# Patient Record
Sex: Female | Born: 1950 | Race: White | Hispanic: No | Marital: Married | Smoking: Never smoker
Health system: Southern US, Community
[De-identification: ages and names within clinical notes are randomized; demographics above are authoritative.]

## PROBLEM LIST (undated history)

## (undated) DIAGNOSIS — F419 Anxiety disorder, unspecified: Secondary | ICD-10-CM

## (undated) DIAGNOSIS — M810 Age-related osteoporosis without current pathological fracture: Secondary | ICD-10-CM

---

## 2008-09-16 ENCOUNTER — Emergency Department: Payer: Self-pay | Admitting: Internal Medicine

## 2012-10-24 ENCOUNTER — Emergency Department: Payer: Self-pay | Admitting: Emergency Medicine

## 2012-10-24 LAB — BASIC METABOLIC PANEL
Anion Gap: 8 (ref 7–16)
EGFR (African American): >60
EGFR (Non-African Amer.): >60
Glucose: 95 mg/dL (ref 65–99)
Osmolality: 265 (ref 275–301)
Potassium: 3.4 mmol/L — ABNORMAL LOW (ref 3.5–5.1)
Sodium: 134 mmol/L — ABNORMAL LOW (ref 136–145)

## 2012-10-24 LAB — TSH: Thyroid Stimulating Horm: 3.43 u[IU]/mL

## 2012-10-24 LAB — CBC
HGB: 15.2 g/dL (ref 12.0–16.0)
MCH: 30.5 pg (ref 26.0–34.0)
MCV: 89 fL (ref 80–100)
RBC: 4.99 10*6/uL (ref 3.80–5.20)
RDW: 13.5 % (ref 11.5–14.5)

## 2012-10-24 LAB — PROTIME-INR: INR: 1

## 2012-10-24 LAB — CK TOTAL AND CKMB (NOT AT ARMC)
CK, Total: 82 U/L (ref 21–215)
CK-MB: 1.6 ng/mL (ref 0.5–3.6)

## 2012-10-24 LAB — TROPONIN I: Troponin-I: <0.02 ng/mL

## 2014-04-24 IMAGING — CT CT NECK-CHEST W/ CM
2 series · 10 of 14 positions shown, 12 images · non-contrast
Comparison: none

REASON FOR EXAM: Patient with difficulty swallowing and feels like she is
unable to take a deep b
COMMENTS:

[Series 2: soft tissue · axial · 0.66mm/px · z∈[-446,-56]mm · 7 of 173 slices shown, 9 images]
[im 22/173  soft-tissue]
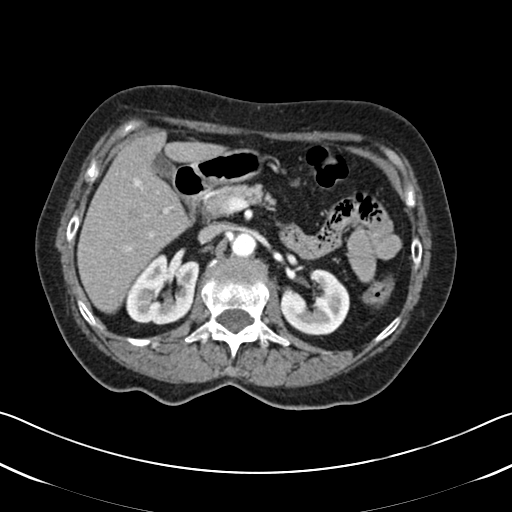
[im 22/173  bone]
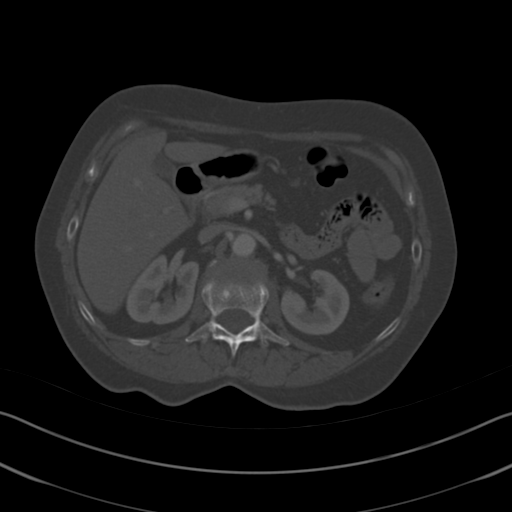
[im 44/173  bone]
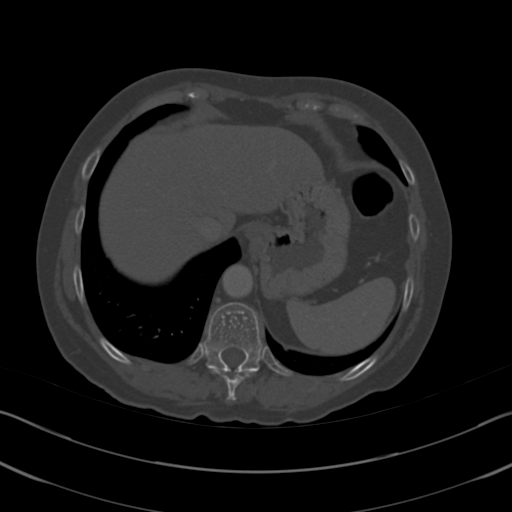
[im 65/173  bone]
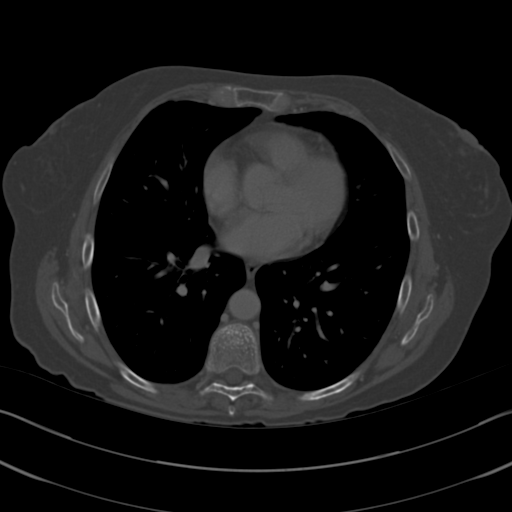
[im 87/173  bone]
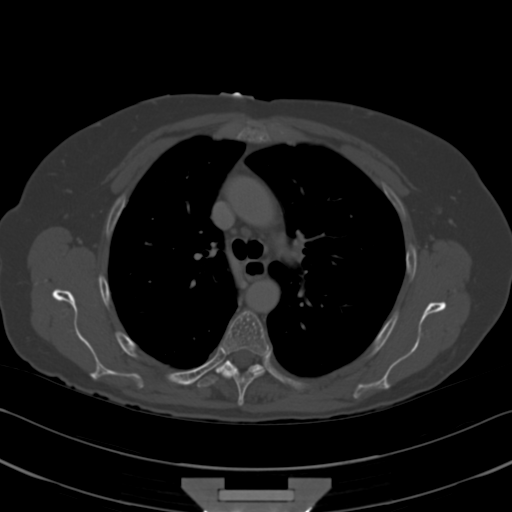
[im 108/173  soft-tissue]
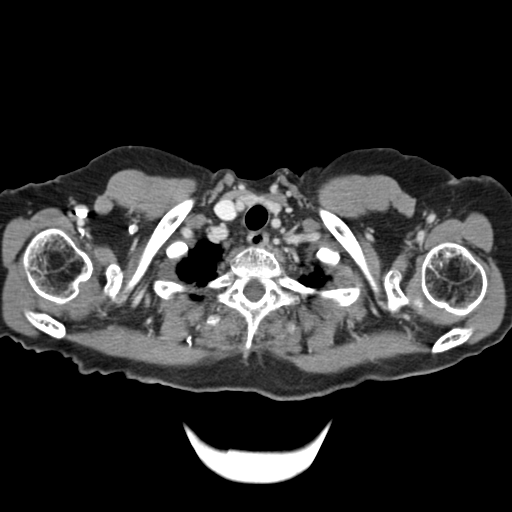
[im 108/173  bone]
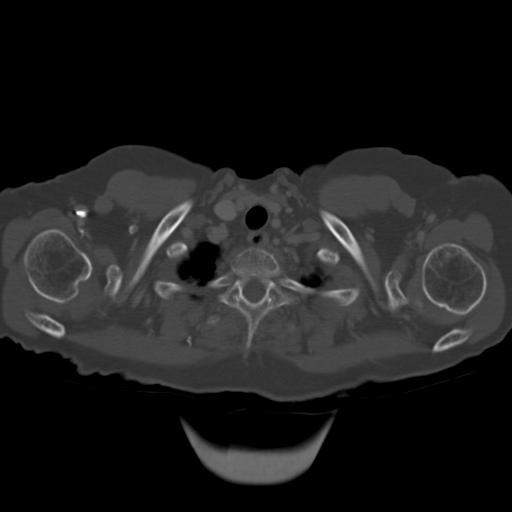
[im 130/173  bone]
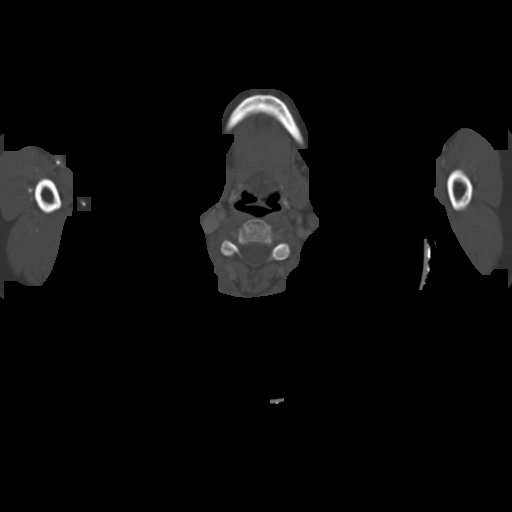
[im 151/173  bone]
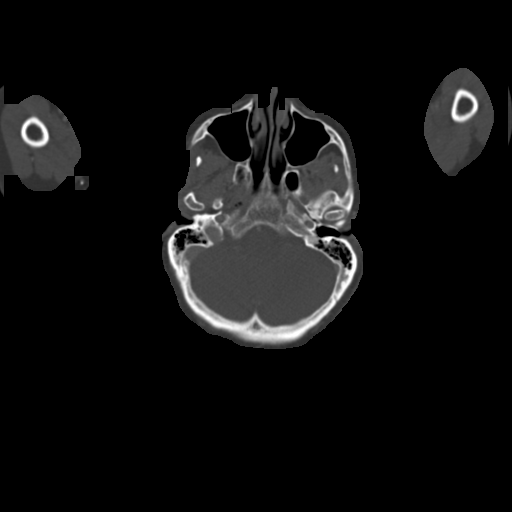

[Series 4: lung windows · axial · 0.66mm/px · z∈[-382,-241]mm · 3 of 95 slices shown]
[im 24/95  bone]
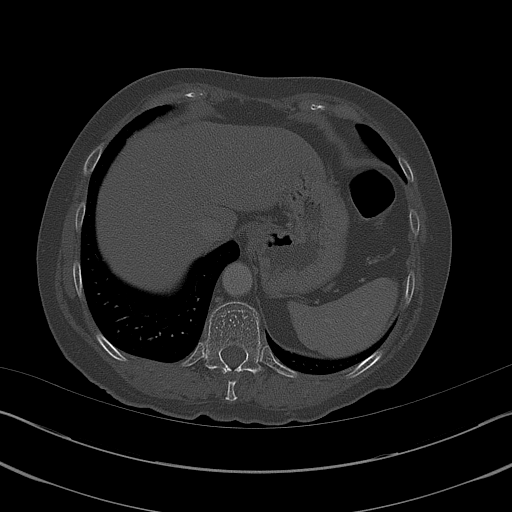
[im 48/95  bone]
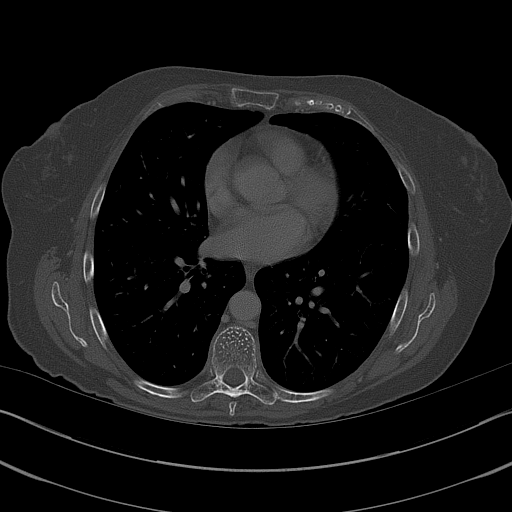
[im 71/95  bone]
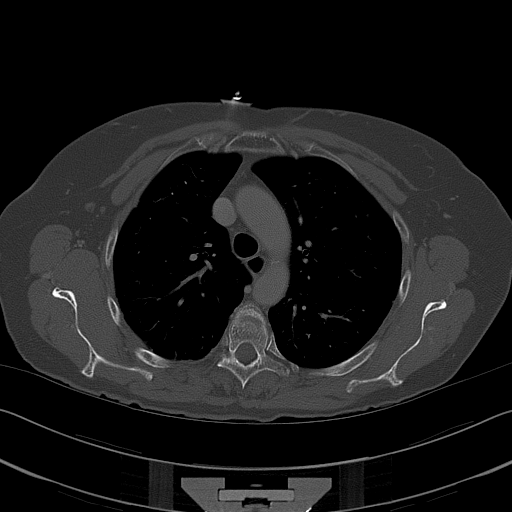

[10 of 14 positions shown; findings below may reference images not displayed]

PROCEDURE:     CT  - CT NECK AND CHEST W  - October 24, 2012  [DATE]

RESULT:     CT of the neck and chest is performed with 75 mL of [id number]
iodinated intravenous contrast with images reconstructed at 3 mm slice
thickness in the axial plane. There is no other previous study for
comparison. The base the brain appears to show no gross abnormality. The
orbital structures, sinuses and mastoid air cells appear to be within normal
limits. The salivary glands are unremarkable without a discrete mass
evident. The nasopharynx, oropharynx and larynx appear to be unremarkable.
The included the thyroid lobes are nonenlarged and enhance homogeneously.
There is air within the esophagus without stenosis or definite wall
thickening. No hiatal hernia is evident. The stomach is nondistended. The
included upper abdominal structures appear unremarkable. There is no
mediastinal or hilar mass or adenopathy. The lungs appear clear area there
is no pneumothorax or focal nodule. There is no infiltrate. No cervical
adenopathy is evident. There is no supraclavicular adenopathy.
IMPRESSION: 1. No focal abnormality evident.

[REDACTED]

## 2018-11-16 DIAGNOSIS — E46 Unspecified protein-calorie malnutrition: Secondary | ICD-10-CM | POA: Insufficient documentation

## 2019-10-13 DIAGNOSIS — U071 COVID-19: Secondary | ICD-10-CM | POA: Insufficient documentation

## 2020-09-17 ENCOUNTER — Emergency Department
Admission: EM | Admit: 2020-09-17 | Discharge: 2020-09-18 | Disposition: A | Discharge status: Home / Self Care | Payer: Medicare Other | Attending: Emergency Medicine | Admitting: Emergency Medicine

## 2020-09-17 ENCOUNTER — Other Ambulatory Visit: Payer: Self-pay

## 2020-09-17 DIAGNOSIS — F419 Anxiety disorder, unspecified: Secondary | ICD-10-CM

## 2020-09-17 DIAGNOSIS — E860 Dehydration: Secondary | ICD-10-CM | POA: Diagnosis not present

## 2020-09-17 HISTORY — DX: Anxiety disorder, unspecified: F41.9

## 2020-09-17 LAB — CBC
HCT: 46.7 % — ABNORMAL HIGH (ref 36.0–46.0)
Hemoglobin: 15.4 g/dL — ABNORMAL HIGH (ref 12.0–15.0)
MCH: 30 pg (ref 26.0–34.0)
MCHC: 33 g/dL (ref 30.0–36.0)
MCV: 91 fL (ref 80.0–100.0)
Platelets: 217 10*3/uL (ref 150–400)
RBC: 5.13 MIL/uL — ABNORMAL HIGH (ref 3.87–5.11)
RDW: 12.8 % (ref 11.5–15.5)
WBC: 6.1 10*3/uL (ref 4.0–10.5)
nRBC: 0 % (ref 0.0–0.2)

## 2020-09-17 LAB — BASIC METABOLIC PANEL
Anion gap: 17 — ABNORMAL HIGH (ref 5–15)
BUN: 12 mg/dL (ref 8–23)
CO2: 17 mmol/L — ABNORMAL LOW (ref 22–32)
Calcium: 9.3 mg/dL (ref 8.9–10.3)
Chloride: 99 mmol/L (ref 98–111)
Creatinine, Ser: 0.67 mg/dL (ref 0.44–1.00)
GFR, Estimated: >60 mL/min (ref >60)
Glucose, Bld: 79 mg/dL (ref 70–99)
Potassium: 4.8 mmol/L (ref 3.5–5.1)
Sodium: 133 mmol/L — ABNORMAL LOW (ref 135–145)

## 2020-09-17 LAB — TROPONIN I (HIGH SENSITIVITY)
Troponin I (High Sensitivity): 5 ng/L (ref <18)
Troponin I (High Sensitivity): 9 ng/L (ref <18)

## 2020-09-17 LAB — T4, FREE: Free T4: 0.69 ng/dL (ref 0.61–1.12)

## 2020-09-17 LAB — MAGNESIUM: Magnesium: 2.2 mg/dL (ref 1.7–2.4)

## 2020-09-17 LAB — TSH: TSH: 13.944 u[IU]/mL — ABNORMAL HIGH (ref 0.350–4.500)

## 2020-09-17 MED ORDER — LORAZEPAM 2 MG/ML IJ SOLN
0.5000 mg | Freq: Once | INTRAMUSCULAR | Status: AC
  Administered 2020-09-17: 0.5 mg via INTRAVENOUS
  Filled 2020-09-17: qty 1

## 2020-09-17 MED ORDER — SODIUM CHLORIDE 0.9 % IV BOLUS
1000.0000 mL | Freq: Once | INTRAVENOUS | Status: AC
  Administered 2020-09-17: 1000 mL via INTRAVENOUS

## 2020-09-17 NOTE — ED Provider Notes (Signed)
[facility] Emergency Department Provider Note  Time seen: 5:51 PM  I have reviewed the triage vital signs and the nursing notes.   HISTORY  Chief Complaint Anxiety    HPI Gina Lane is a 70 y.o. 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  Patient is extremely anxious throughout our evaluation.  Patient admits to anxiety and panic attacks have been worsening over the past few months.  Patient states she was prescribed Ativan by her doctor which she has been taking once a day however discontinued this 2 days ago because she was afraid she was addicted to the medication or reliant on the medication.  Patient states she has not been able to eat or drink over the past 2 to 3 days due to severe anxiety.  Denies any nausea or vomiting but states her "body cannot handle it."  Patient is here with her husband for evaluation.  Has never seen a psychiatrist.  Patient states she takes iodine tablets as well as natural vitamins for hyperthyroidism and adrenal dysfunction which she believes are the cause of her anxiety but has never been diagnosed with either.   Past Medical History:  Diagnosis Date   Anxiety     There are no problems to display for this patient.   Prior to Admission medications   Not on File    Not on File  No family history on file.  Social History    Review of Systems Constitutional: Negative for fever.  Severe anxiety. Cardiovascular: Negative for chest pain. Respiratory: Intermittent shortness of breath with her "panic attacks." Gastrointestinal: Negative for abdominal pain, vomiting and diarrhea. Genitourinary: Negative for urinary compaints Musculoskeletal: Negative for musculoskeletal complaints Neurological: Negative for  headache All other ROS negative  ____________________________________________   PHYSICAL EXAM:  VITAL SIGNS: ED Triage Vitals  Enc Vitals Group     BP 09/17/20 1446 (!) 147/99     Pulse Rate 09/17/20 1446 (!) 116     Resp 09/17/20 1446 (!) 22     Temp 09/17/20 1446 99.4 F (37.4 C)     Temp Source 09/17/20 1446 Oral     SpO2 09/17/20 1446 99 %     Weight 09/17/20 1447 94 lb (42.6 kg)     Height 09/17/20 1447 5' (1.524 m)     Head Circumference --      Peak Flow --      Pain Score 09/17/20 1447 0     Pain Loc --      Pain Edu? --      Excl. in GC? --    Constitutional: Alert and oriented.  Very anxious. Eyes: Normal exam ENT      Head: Normocephalic and atraumatic.      Mouth/Throat: Mucous membranes are moist. Cardiovascular: Normal rate, regular rhythm around 100 bpm. Respiratory: Normal respiratory effort without tachypnea nor retractions. Breath sounds are clear  Gastrointestinal: Soft and nontender. No distention.   Musculoskeletal: Nontender with normal range of motion in all extremities. Neurologic:  Normal speech and language. No gross focal neurologic deficits  Skin:  Skin is warm, dry and intact.  Psychiatric: Extremely anxious.  ____________________________________________    EKG  EKG viewed and interpreted by myself shows sinus rhythm at 100 bpm with a narrow QRS, normal axis, normal intervals, nonspecific ST changes.  INITIAL IMPRESSION / ASSESSMENT AND PLAN / ED COURSE  Pertinent labs & imaging results that were available during my care of the patient were reviewed by me and considered in my medical decision making (see chart for details).   Patient presents to the emergency department for anxiety symptoms not eating or drinking for the past 2 to 3 days.  Patient states she has not been sleeping well over the past week or more.  Patient is quite anxious throughout our evaluation.  States she is self treating her hyperthyroidism and adrenal dysfunction  which she believes other cause for anxiety but has never been formally diagnosed with either.  We will check labs including a thyroid panel.  We will treat with IV fluids and consult psychiatry for further evaluation.  No criteria for IVC at this time but I do believe the patient would benefit from psychiatric evaluation.  Patient is agreeable.  Patient's work-up is largely nonrevealing.  Slightly elevated TSH although normal free T4.  Troponin negative.  Magnesium normal.  Patient was initially dehydrated with her BMP anion gap of 17, has received a liter of fluids.  Patient given 0.5 mg Ativan through her IV now she appears much better.  States she feels normal, appears much improved on repeat evaluation as well.  We are currently awaiting telemetry psychiatry to speak to the patient.  I informed the patient that it could be several hours and she still wishes to wait for now.  Gina Lane was evaluated in Emergency Department on 09/17/2020 for the symptoms described in the history of present illness. She was evaluated in the context of the global COVID-19 pandemic, which necessitated consideration that the patient might be at risk for infection with the SARS-CoV-2 virus that causes COVID-19. Institutional protocols and algorithms that pertain to the evaluation of patients at risk for COVID-19 are in a state of rapid change based on information released by regulatory bodies including the CDC and federal and state organizations. These policies and algorithms were followed during the patient's care in the ED.  ____________________________________________   FINAL CLINICAL IMPRESSION(S) / ED DIAGNOSES  Anxiety Dehydration   Minna Antis, MD 09/17/20 2153

## 2020-09-17 NOTE — ED Triage Notes (Signed)
See triage note, pt reports unable to eat for the past few days. States eating makes her lose her breath and have panic attacks Pt denies pain, states she is just having to count her breathing  Was recently seen for same on Thursday, had cxray and px anxiety meds

## 2020-09-17 NOTE — ED Provider Notes (Signed)
-----------------------------------------   11:17 PM on 09/17/2020 -----------------------------------------  Assuming care from Dr. Lenard Lance.  In short, Gina Lane is a 70 y.o. female with a chief complaint of anxiety.  Refer to the original H&P for additional details.  The current plan of care is to be evaluated by psychiatry.  Anticipate discharge.   ----------------------------------------- 12:36 AM on 09/18/2020 -----------------------------------------  I discussed the case in person with [name] from psychiatry.  She agrees that this is likely anxiety/panic attack related but the patient does not want to try any medications.  She is very leery of medicines and prefers homeopathic and naturalistic remedies.  Initially there was some discussion of her staying in the emergency department for 24 hours to see if she can eat but I explained that that was not practical or necessary and the patient is comfortable with the plan for discharge home.  She is going to try eating smaller amounts at a time and will follow up with her regular provider on Monday.  I gave my usual and customary return precautions.  She does not meet criteria for psychiatric admission at this time nor for medical admission.  The patient is pleased with the evaluation both medically and psychiatrically and will follow-up as an outpatient.   Loleta Rose, MD 09/18/20 [phone]

## 2020-09-17 NOTE — ED Notes (Signed)
Brief changed ?

## 2020-09-17 NOTE — ED Triage Notes (Signed)
Pt in via EMS from home with c/o anxiety. EMS reports pt feels SOB and has not been able to eat for 2 days. With coaching pt able slow breathing down.pt with hx of anxiety, was recently placed on lorazepam. Pt reports took one it helped but then it wore off and her anxiety is back

## 2021-03-05 DIAGNOSIS — E039 Hypothyroidism, unspecified: Secondary | ICD-10-CM | POA: Diagnosis present

## 2021-07-29 ENCOUNTER — Emergency Department: Payer: Medicare Other

## 2021-07-29 ENCOUNTER — Emergency Department
Admission: EM | Admit: 2021-07-29 | Discharge: 2021-07-29 | Disposition: A | Discharge status: Home / Self Care | Payer: Medicare Other | Attending: Emergency Medicine | Admitting: Emergency Medicine

## 2021-07-29 ENCOUNTER — Other Ambulatory Visit: Payer: Self-pay

## 2021-07-29 DIAGNOSIS — F419 Anxiety disorder, unspecified: Secondary | ICD-10-CM | POA: Diagnosis not present

## 2021-07-29 DIAGNOSIS — R079 Chest pain, unspecified: Secondary | ICD-10-CM | POA: Diagnosis not present

## 2021-07-29 DIAGNOSIS — R0602 Shortness of breath: Secondary | ICD-10-CM | POA: Diagnosis present

## 2021-07-29 DIAGNOSIS — R0682 Tachypnea, not elsewhere classified: Secondary | ICD-10-CM | POA: Insufficient documentation

## 2021-07-29 LAB — CBC
HCT: 46.3 % — ABNORMAL HIGH (ref 36.0–46.0)
Hemoglobin: 15 g/dL (ref 12.0–15.0)
MCH: 29.7 pg (ref 26.0–34.0)
MCHC: 32.4 g/dL (ref 30.0–36.0)
MCV: 91.7 fL (ref 80.0–100.0)
Platelets: 237 10*3/uL (ref 150–400)
RBC: 5.05 MIL/uL (ref 3.87–5.11)
RDW: 12.4 % (ref 11.5–15.5)
WBC: 7.2 10*3/uL (ref 4.0–10.5)
nRBC: 0 % (ref 0.0–0.2)

## 2021-07-29 LAB — BASIC METABOLIC PANEL
Anion gap: 14 (ref 5–15)
BUN: 16 mg/dL (ref 8–23)
CO2: 21 mmol/L — ABNORMAL LOW (ref 22–32)
Calcium: 9.6 mg/dL (ref 8.9–10.3)
Chloride: 100 mmol/L (ref 98–111)
Creatinine, Ser: 0.73 mg/dL (ref 0.44–1.00)
GFR, Estimated: >60 mL/min (ref >60)
Glucose, Bld: 97 mg/dL (ref 70–99)
Potassium: 3.9 mmol/L (ref 3.5–5.1)
Sodium: 135 mmol/L (ref 135–145)

## 2021-07-29 LAB — TROPONIN I (HIGH SENSITIVITY): Troponin I (High Sensitivity): 9 ng/L

## 2021-07-29 MED ORDER — LORAZEPAM 0.5 MG PO TABS: 0.5000 mg | ORAL_TABLET | Freq: Once | ORAL | Status: DC

## 2021-07-29 MED ORDER — IOHEXOL 350 MG/ML SOLN
75.0000 mL | Freq: Once | INTRAVENOUS | Status: AC | PRN
  Administered 2021-07-29: 75 mL via INTRAVENOUS

## 2021-07-29 MED ORDER — LORAZEPAM 2 MG/ML IJ SOLN
0.5000 mg | Freq: Once | INTRAMUSCULAR | Status: AC
  Administered 2021-07-29: 0.5 mg via INTRAVENOUS
  Filled 2021-07-29: qty 1
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
## 2021-07-29 NOTE — ED Triage Notes (Signed)
Pt arrives via EMS from home- pt states she got up at 0700 to take her lorazepam- pt states that when she got up for breakfast she started having pain in her ribs under her L breast- pt states after that started she started having anxiety- pt states she is unsure if she is reacting to the lorazepam or if the pain in her ribs is causing her anxiety- pt states she tried to get off the lorazepam a few weeks ago and was weaning off of it and switching to mirtazapine but her anxiety got worse

## 2021-08-23 DIAGNOSIS — M810 Age-related osteoporosis without current pathological fracture: Secondary | ICD-10-CM | POA: Diagnosis present

## 2021-09-04 ENCOUNTER — Emergency Department: Payer: Medicare Other

## 2021-09-04 ENCOUNTER — Other Ambulatory Visit: Payer: Self-pay

## 2021-09-04 ENCOUNTER — Encounter: Payer: Self-pay | Admitting: Intensive Care

## 2021-09-04 ENCOUNTER — Inpatient Hospital Stay
Admission: EM | Admit: 2021-09-04 | Discharge: 2021-09-06 | DRG: 871 | Disposition: A | Discharge status: Home Health | Payer: Medicare Other | Attending: Internal Medicine | Admitting: Internal Medicine

## 2021-09-04 DIAGNOSIS — M810 Age-related osteoporosis without current pathological fracture: Secondary | ICD-10-CM | POA: Diagnosis present

## 2021-09-04 DIAGNOSIS — E876 Hypokalemia: Secondary | ICD-10-CM | POA: Diagnosis present

## 2021-09-04 DIAGNOSIS — E871 Hypo-osmolality and hyponatremia: Secondary | ICD-10-CM | POA: Diagnosis present

## 2021-09-04 DIAGNOSIS — A419 Sepsis, unspecified organism: Secondary | ICD-10-CM | POA: Diagnosis present

## 2021-09-04 DIAGNOSIS — R918 Other nonspecific abnormal finding of lung field: Secondary | ICD-10-CM | POA: Diagnosis present

## 2021-09-04 DIAGNOSIS — J9811 Atelectasis: Secondary | ICD-10-CM | POA: Diagnosis present

## 2021-09-04 DIAGNOSIS — F419 Anxiety disorder, unspecified: Secondary | ICD-10-CM

## 2021-09-04 DIAGNOSIS — R531 Weakness: Secondary | ICD-10-CM

## 2021-09-04 DIAGNOSIS — Z20822 Contact with and (suspected) exposure to covid-19: Secondary | ICD-10-CM | POA: Diagnosis present

## 2021-09-04 DIAGNOSIS — M4854XA Collapsed vertebra, not elsewhere classified, thoracic region, initial encounter for fracture: Secondary | ICD-10-CM | POA: Diagnosis present

## 2021-09-04 DIAGNOSIS — Z880 Allergy status to penicillin: Secondary | ICD-10-CM

## 2021-09-04 DIAGNOSIS — A409 Streptococcal sepsis, unspecified: Secondary | ICD-10-CM | POA: Diagnosis present

## 2021-09-04 DIAGNOSIS — J189 Pneumonia, unspecified organism: Secondary | ICD-10-CM

## 2021-09-04 DIAGNOSIS — Z8616 Personal history of COVID-19: Secondary | ICD-10-CM

## 2021-09-04 DIAGNOSIS — Z79899 Other long term (current) drug therapy: Secondary | ICD-10-CM

## 2021-09-04 DIAGNOSIS — J13 Pneumonia due to Streptococcus pneumoniae: Secondary | ICD-10-CM | POA: Diagnosis present

## 2021-09-04 DIAGNOSIS — E039 Hypothyroidism, unspecified: Secondary | ICD-10-CM | POA: Diagnosis present

## 2021-09-04 HISTORY — DX: Age-related osteoporosis without current pathological fracture: M81.0

## 2021-09-04 LAB — COMPREHENSIVE METABOLIC PANEL
ALT: 21 U/L (ref 0–44)
AST: 26 U/L (ref 15–41)
Albumin: 3.8 g/dL (ref 3.5–5.0)
Alkaline Phosphatase: 101 U/L (ref 38–126)
Anion gap: 14 (ref 5–15)
BUN: 9 mg/dL (ref 8–23)
CO2: 23 mmol/L (ref 22–32)
Calcium: 9 mg/dL (ref 8.9–10.3)
Chloride: 92 mmol/L — ABNORMAL LOW (ref 98–111)
Creatinine, Ser: 0.64 mg/dL (ref 0.44–1.00)
GFR, Estimated: >60 mL/min (ref >60)
Glucose, Bld: 122 mg/dL — ABNORMAL HIGH (ref 70–99)
Potassium: 3.7 mmol/L (ref 3.5–5.1)
Sodium: 129 mmol/L — ABNORMAL LOW (ref 135–145)
Total Bilirubin: 0.8 mg/dL (ref 0.3–1.2)
Total Protein: 7.9 g/dL (ref 6.5–8.1)

## 2021-09-04 LAB — PROTIME-INR
INR: 1.1 (ref 0.8–1.2)
Prothrombin Time: 14 seconds (ref 11.4–15.2)

## 2021-09-04 LAB — CBC WITH DIFFERENTIAL/PLATELET
Abs Immature Granulocytes: 0.05 10*3/uL (ref 0.00–0.07)
Basophils Absolute: 0.1 10*3/uL (ref 0.0–0.1)
Basophils Relative: 0 %
Eosinophils Absolute: 0.1 10*3/uL (ref 0.0–0.5)
Eosinophils Relative: 1 %
HCT: 40.1 % (ref 36.0–46.0)
Hemoglobin: 13.3 g/dL (ref 12.0–15.0)
Immature Granulocytes: 0 %
Lymphocytes Relative: 3 %
Lymphs Abs: 0.5 10*3/uL — ABNORMAL LOW (ref 0.7–4.0)
MCH: 29.6 pg (ref 26.0–34.0)
MCHC: 33.2 g/dL (ref 30.0–36.0)
MCV: 89.1 fL (ref 80.0–100.0)
Monocytes Absolute: 1.4 10*3/uL — ABNORMAL HIGH (ref 0.1–1.0)
Monocytes Relative: 9 %
Neutro Abs: 13.2 10*3/uL — ABNORMAL HIGH (ref 1.7–7.7)
Neutrophils Relative %: 87 %
Platelets: 304 10*3/uL (ref 150–400)
RBC: 4.5 MIL/uL (ref 3.87–5.11)
RDW: 13 % (ref 11.5–15.5)
WBC: 15.2 10*3/uL — ABNORMAL HIGH (ref 4.0–10.5)
nRBC: 0 % (ref 0.0–0.2)

## 2021-09-04 LAB — LACTIC ACID, PLASMA
Lactic Acid, Venous: 1.2 mmol/L (ref 0.5–1.9)
Lactic Acid, Venous: 0.8 mmol/L (ref 0.5–1.9)

## 2021-09-04 LAB — RESP PANEL BY RT-PCR (FLU A&B, COVID) ARPGX2
Influenza A by PCR: NEGATIVE
Influenza B by PCR: NEGATIVE
SARS Coronavirus 2 by RT PCR: NEGATIVE

## 2021-09-04 LAB — TROPONIN I (HIGH SENSITIVITY)
Troponin I (High Sensitivity): 12 ng/L (ref <18)
Troponin I (High Sensitivity): 10 ng/L (ref <18)

## 2021-09-04 LAB — APTT: aPTT: 41 seconds — ABNORMAL HIGH (ref 24–36)

## 2021-09-04 MED ORDER — SODIUM CHLORIDE 0.9 % IV SOLN: 2.0000 g | INTRAVENOUS | Status: DC

## 2021-09-04 MED ORDER — SODIUM CHLORIDE 0.9 % IV SOLN
500.0000 mg | INTRAVENOUS | Status: DC
  Administered 2021-09-05 – 2021-09-06 (×2): 500 mg via INTRAVENOUS
  Filled 2021-09-04: qty 500
  Filled 2021-09-04: qty 5

## 2021-09-04 MED ORDER — SODIUM CHLORIDE 0.9 % IV SOLN: 500.0000 mg | INTRAVENOUS | Status: DC

## 2021-09-04 MED ORDER — METRONIDAZOLE 500 MG/100ML IV SOLN
500.0000 mg | Freq: Once | INTRAVENOUS | Status: AC
  Administered 2021-09-04: 500 mg via INTRAVENOUS
  Filled 2021-09-04: qty 100

## 2021-09-04 MED ORDER — SODIUM CHLORIDE 0.9 % IV SOLN
2.0000 g | INTRAVENOUS | Status: DC
  Administered 2021-09-05 – 2021-09-06 (×2): 2 g via INTRAVENOUS
  Filled 2021-09-04: qty 2
  Filled 2021-09-04: qty 20

## 2021-09-04 MED ORDER — SODIUM CHLORIDE 0.9 % IV SOLN
2.0000 g | Freq: Once | INTRAVENOUS | Status: AC
  Administered 2021-09-04: 2 g via INTRAVENOUS
  Filled 2021-09-04: qty 10

## 2021-09-04 MED ORDER — ACETAMINOPHEN 325 MG PO TABS
650.0000 mg | ORAL_TABLET | Freq: Four times a day (QID) | ORAL | Status: DC | PRN
  Administered 2021-09-05 – 2021-09-06 (×4): 650 mg via ORAL
  Filled 2021-09-04 (×5): qty 2

## 2021-09-04 MED ORDER — ONDANSETRON HCL 4 MG/2ML IJ SOLN: 4.0000 mg | Freq: Four times a day (QID) | INTRAMUSCULAR | Status: DC | PRN

## 2021-09-04 MED ORDER — ENOXAPARIN SODIUM 30 MG/0.3ML IJ SOSY
30.0000 mg | PREFILLED_SYRINGE | INTRAMUSCULAR | Status: DC
Start: 2021-09-04 — End: 2021-09-06
  Administered 2021-09-05 (×2): 30 mg via SUBCUTANEOUS
  Filled 2021-09-04 (×2): qty 0.3

## 2021-09-04 MED ORDER — VANCOMYCIN HCL IN DEXTROSE 1-5 GM/200ML-% IV SOLN
1000.0000 mg | Freq: Once | INTRAVENOUS | Status: AC
  Administered 2021-09-04: 1000 mg via INTRAVENOUS
  Filled 2021-09-04: qty 200

## 2021-09-04 MED ORDER — ACETAMINOPHEN 325 MG PO TABS
650.0000 mg | ORAL_TABLET | Freq: Once | ORAL | Status: AC
  Administered 2021-09-04: 650 mg via ORAL
  Filled 2021-09-04: qty 2

## 2021-09-04 MED ORDER — LORAZEPAM 0.5 MG PO TABS
0.5000 mg | ORAL_TABLET | Freq: Three times a day (TID) | ORAL | Status: DC | PRN
  Administered 2021-09-05 – 2021-09-06 (×2): 0.5 mg via ORAL
  Filled 2021-09-04 (×2): qty 1

## 2021-09-04 MED ORDER — SODIUM CHLORIDE 0.9 % IV BOLUS
1000.0000 mL | Freq: Once | INTRAVENOUS | Status: AC
  Administered 2021-09-04: 1000 mL via INTRAVENOUS

## 2021-09-04 MED ORDER — LACTATED RINGERS IV SOLN: INTRAVENOUS | Status: AC

## 2021-09-04 MED ORDER — IOHEXOL 350 MG/ML SOLN
75.0000 mL | Freq: Once | INTRAVENOUS | Status: AC | PRN
  Administered 2021-09-04: 75 mL via INTRAVENOUS

## 2021-09-04 MED ORDER — MIRTAZAPINE 15 MG PO TABS
30.0000 mg | ORAL_TABLET | Freq: Every day | ORAL | Status: DC
Start: 2021-09-04 — End: 2021-09-06
  Administered 2021-09-05 (×2): 30 mg via ORAL
  Filled 2021-09-04 (×2): qty 2

## 2021-09-04 MED ORDER — ACETAMINOPHEN 650 MG RE SUPP: 650.0000 mg | Freq: Four times a day (QID) | RECTAL | Status: DC | PRN

## 2021-09-04 MED ORDER — ONDANSETRON HCL 4 MG PO TABS: 4.0000 mg | ORAL_TABLET | Freq: Four times a day (QID) | ORAL | Status: DC | PRN

## 2021-09-04 MED ORDER — SODIUM CHLORIDE 0.9 % IV BOLUS (SEPSIS)
1000.0000 mL | Freq: Once | INTRAVENOUS | Status: AC
  Administered 2021-09-04: 1000 mL via INTRAVENOUS

## 2021-09-04 NOTE — Consult Note (Signed)
CODE SEPSIS - PHARMACY COMMUNICATION  **Broad Spectrum Antibiotics should be administered within 1 hour of Sepsis diagnosis**  Time Code Sepsis Called/Page Received: 1827  Antibiotics Ordered: 1827  Time of 1st antibiotic administration: 1856  Additional action taken by pharmacy: n/a  If necessary, Name of Provider/Nurse Contacted: n/a    Derrek Gu ,PharmD Clinical Pharmacist  09/04/2021  6:32 PM

## 2021-09-04 NOTE — Assessment & Plan Note (Signed)
Sepsis criteria includes fever, tachycardia, tachypnea leukocytosis Continue sepsis fluids and treat pneumonia as outlined

## 2021-09-04 NOTE — Assessment & Plan Note (Addendum)
3.1 cm x 3.8 cm x 3.8 cm left upper lobe mass...concerning for malignancy.  Further evaluation with nuclear medicine PET/CT or tissue sampling is recommended.

## 2021-09-04 NOTE — ED Provider Triage Note (Signed)
Emergency Medicine Provider Triage Evaluation Note  Gina Lane , a 71 y.o. female with a history of anxiety who was evaluated in triage.  Pt complains of cough, fever. Also SOB. Tylenol at noon. No dysuria. No recent travel.  There are no problems to display for this patient.    Review of Systems  Positive: Cough, sob, chest pressure, fever Negative: Abd pain, n/v/d, dysuria  Physical Exam  There were no vitals taken for this visit. Gen:   Awake, no distress   Resp:  Normal effort, speaking in complete sentences MSK:   Moves extremities without difficulty  Other:    Medical Decision Making  Medically screening exam initiated at 5:51 PM.  Appropriate orders placed.  Gina Lane was informed that the remainder of the evaluation will be completed by another provider, this initial triage assessment does not replace that evaluation, and the importance of remaining in the ED until their evaluation is complete.    Gina Hoehn, PA-C 09/04/21 1800

## 2021-09-04 NOTE — H&P (Addendum)
History and Physical    Patient: Gina Lane MWN:[id number] DOB: 05-Feb-1951 DOA: 09/04/2021 DOS: the patient was seen and examined on 09/04/2021 PCP: Gildardo Pounds, PA  Patient coming from: Home  Chief Complaint:  Chief Complaint  Patient presents with   Fever   Back Pain   Shortness of Breath    HPI: Gina Lane is a 71 y.o. female with medical history significant for Anxiety, hypothyroidism and osteoporosis, who was seen in the ED on 07/29/2021 for anterior chest wall pain with CTA chest was negative except for thoracic vertebral compression fractures, who who presents to the ED with a 1 week history of cough, fever, back pain and weakness.  Over the past 3 days she started having chest pain on coughing.  She denies shortness of breath, palpitations.  Denies nausea, vomiting or abdominal pain.  Denies affected contacts. ED course and data review: On arrival temperature 102.4 with pulse 136.  Respirations 22-36 with O2 sat 94 to 97% on room air.  BP 127/94. Labs: WBC 15,000 with lactic acid 0.8.  Sodium 129.  Troponin 10.  COVID and flu negative. CTA chest with the following findings: IMPRESSION: 1. No evidence of pulmonary embolism. 2. 3.1 cm x 3.8 cm x 3.8 cm left upper lobe mass. Despite its rapid development since the prior study, there is concerning for malignancy. Further evaluation with nuclear medicine PET/CT or tissue sampling is recommended. Reference: Radiology. 2017; 284(1):228-43 3. Small bilateral pleural effusions with mild to moderate severity posterior bilateral lower lobe atelectasis. 4. Enlarged precarinal and subcarinal lymph nodes which may be, in part, reactive.  Patient started on broad-spectrum antibiotics and sepsis fluids.  Hospitalist consulted for admission.     Past Medical History:  Diagnosis Date   Anxiety    Osteoporosis    History reviewed. No pertinent surgical history. Social History:  reports that she has never smoked. She has  never used smokeless tobacco. She reports current alcohol use of about 7.0 standard drinks of alcohol per week. She reports that she does not use drugs.  Allergies  Allergen Reactions   Penicillins Other (See Comments)    unknown unknown     History reviewed. No pertinent family history.  Prior to Admission medications   Medication Sig Start Date End Date Taking? Authorizing Provider  Ascorbic Acid (VITAMIN C) 1000 MG tablet Take 1,000 mg by mouth daily.   Yes [provider]  LORazepam (ATIVAN) 0.5 MG tablet Take 0.5 mg by mouth every 8 (eight) hours as needed. 09/09/20  Yes [provider]  PARoxetine (PAXIL) 10 MG tablet SMARTSIG:1 Tablet(s) By Mouth Every Evening 08/08/21  Yes [provider]  vitamin B-12 (CYANOCOBALAMIN) 500 MCG tablet Take 500 mcg by mouth daily.   Yes [provider]  mirtazapine (REMERON) 30 MG tablet Take 30 mg by mouth at bedtime. 08/08/21   [provider]  triamcinolone cream (KENALOG) 0.1 % Apply topically 2 (two) times daily as needed. 08/08/21   [provider]    Physical Exam: Vitals:   09/04/21 1900 09/04/21 1930 09/04/21 2100 09/04/21 2102  BP: 131/68 126/83 129/72   Pulse: 97 98 98   Resp: (!) 24 (!) 33 (!) 36   Temp:    98.9 F (37.2 C)  TempSrc:    Oral  SpO2: 96% 95% 97%   Weight:      Height:       Physical Exam Vitals and nursing note reviewed.  Constitutional:  General: She is not in acute distress. HENT:     Head: Normocephalic and atraumatic.  Cardiovascular:     Rate and Rhythm: Regular rhythm. Tachycardia present.     Heart sounds: Normal heart sounds.  Pulmonary:     Effort: Pulmonary effort is normal.     Breath sounds: Rales present.  Abdominal:     Palpations: Abdomen is soft.     Tenderness: There is no abdominal tenderness.  Neurological:     Mental Status: Mental status is at baseline.     Labs on Admission: I have personally reviewed following labs and  imaging studies  CBC: Recent Labs  Lab 09/04/21 1803  WBC 15.2*  NEUTROABS 13.2*  HGB 13.3  HCT 40.1  MCV 89.1  PLT 304   Basic Metabolic Panel: Recent Labs  Lab 09/04/21 1803  NA 129*  K 3.7  CL 92*  CO2 23  GLUCOSE 122*  BUN 9  CREATININE 0.64  CALCIUM 9.0   GFR: Estimated Creatinine Clearance: 38.2 mL/min (by C-G formula based on SCr of 0.64 mg/dL). Liver Function Tests: Recent Labs  Lab 09/04/21 1803  AST 26  ALT 21  ALKPHOS 101  BILITOT 0.8  PROT 7.9  ALBUMIN 3.8   No results for input(s): "LIPASE", "AMYLASE" in the last 168 hours. No results for input(s): "AMMONIA" in the last 168 hours. Coagulation Profile: Recent Labs  Lab 09/04/21 1827  INR 1.1   Cardiac Enzymes: No results for input(s): "CKTOTAL", "CKMB", "CKMBINDEX", "TROPONINI" in the last 168 hours. BNP (last 3 results) No results for input(s): "PROBNP" in the last 8760 hours. HbA1C: No results for input(s): "HGBA1C" in the last 72 hours. CBG: No results for input(s): "GLUCAP" in the last 168 hours. Lipid Profile: No results for input(s): "CHOL", "HDL", "LDLCALC", "TRIG", "CHOLHDL", "LDLDIRECT" in the last 72 hours. Thyroid Function Tests: No results for input(s): "TSH", "T4TOTAL", "FREET4", "T3FREE", "THYROIDAB" in the last 72 hours. Anemia Panel: No results for input(s): "VITAMINB12", "FOLATE", "FERRITIN", "TIBC", "IRON", "RETICCTPCT" in the last 72 hours. Urine analysis: No results found for: "COLORURINE", "APPEARANCEUR", "LABSPEC", "PHURINE", "GLUCOSEU", "HGBUR", "BILIRUBINUR", "KETONESUR", "PROTEINUR", "UROBILINOGEN", "NITRITE", "LEUKOCYTESUR"  Radiological Exams on Admission: CT Angio Chest PE W and/or Wo Contrast  Result Date: 09/04/2021 CLINICAL DATA:  Fever, cough and chest pain. EXAM: CT ANGIOGRAPHY CHEST WITH CONTRAST TECHNIQUE: Multidetector CT imaging of the chest was performed using the standard protocol during bolus administration of intravenous contrast. Multiplanar CT  image reconstructions and MIPs were obtained to evaluate the vascular anatomy. RADIATION DOSE REDUCTION: This exam was performed according to the departmental dose-optimization program which includes automated exposure control, adjustment of the mA and/or kV according to patient size and/or use of iterative reconstruction technique. CONTRAST:  42mL OMNIPAQUE IOHEXOL 350 MG/ML SOLN COMPARISON:  Jul 29, 2021 FINDINGS: Cardiovascular: There is mild calcification of the aortic arch without evidence of aortic aneurysmal dissection. Satisfactory opacification of the pulmonary arteries to the segmental level. No evidence of pulmonary embolism. Normal heart size with mild coronary artery calcification. No pericardial effusion. Mediastinum/Nodes: A 1.5 cm precarinal lymph node is seen. 1.1 cm and 1.3 cm subcarinal lymph nodes are also noted. Thyroid gland, trachea, and esophagus demonstrate no significant findings. Lungs/Pleura: A 3.1 cm x 3.8 cm x 3.8 cm heterogeneous mass is seen within the anteromedial aspect of the left upper lobe. This is located just above the aortic arch and represents a new finding when compared to the prior study. A mild amount of surrounding atelectasis is noted.  Mild to moderate severity atelectasis is also seen within the posterior aspects of the bilateral lower lobes. Mild bronchiectasis is also seen within these regions with mild mucous plugging. There are small bilateral pleural effusions. No pneumothorax is identified. Upper Abdomen: No acute abnormality. Musculoskeletal: Exaggerated kyphosis of the thoracic spine is seen with chronic compression fracture deformities noted at the levels of T9, T11 and L1. Review of the MIP images confirms the above findings. IMPRESSION: 1. No evidence of pulmonary embolism. 2. 3.1 cm x 3.8 cm x 3.8 cm left upper lobe mass. Despite its rapid development since the prior study, there is concerning for malignancy. Further evaluation with nuclear medicine PET/CT  or tissue sampling is recommended. Reference: Radiology. 2017; 284(1):228-43 3. Small bilateral pleural effusions with mild to moderate severity posterior bilateral lower lobe atelectasis. 4. Enlarged precarinal and subcarinal lymph nodes which may be, in part, reactive. Aortic Atherosclerosis (ICD10-I70.0). Electronically Signed   By: Aram Candela M.D.   On: 09/04/2021 20:31   DG Chest 2 View  Result Date: 09/04/2021 CLINICAL DATA:  Cough and fever EXAM: CHEST - 2 VIEW COMPARISON:  07/29/2021 FINDINGS: Small bilateral pleural effusions. No focal consolidation. Stable cardiomediastinal silhouette. No pneumothorax. Multiple compression deformities of the mid to lower thoracic spine IMPRESSION: Interval small bilateral pleural effusion Electronically Signed   By: Jasmine Pang M.D.   On: 09/04/2021 18:42     Data Reviewed: Relevant notes from primary care and specialist visits, past discharge summaries as available in EHR, including Care Everywhere. Prior diagnostic testing as pertinent to current admission diagnoses Updated medications and problem lists for reconciliation ED course, including vitals, labs, imaging, treatment and response to treatment Triage notes, nursing and pharmacy notes and ED provider's notes Notable results as noted in HPI   Assessment and Plan: * Sepsis (HCC) Sepsis criteria includes fever, tachycardia, tachypnea leukocytosis Continue sepsis fluids and treat pneumonia as outlined  Mass of upper lobe of left lung  3.1 cm x 3.8 cm x 3.8 cm left upper lobe mass...concerning for malignancy.  Further evaluation with nuclear medicine PET/CT or tissue sampling is recommended.    CAP (community acquired pneumonia) Possible postobstructive pneumonia, given left upper lobe mass on CT Continue ceftriaxone and azithromycin (patient got aztreonam, Flagyl and vancomycin for sepsis of unknown source prior to CTA chest result) Supplemental O2 if needed, DuoNebs as needed,  antitussives and supportive care Consider ID consult given rapid appearance of left upper lobe heterogeneous mass when compared to prior CT from 07/29/2021  Age-related osteoporosis without current pathological fracture History of falls and wedge compression fractures  Acquired hypothyroidism Not currently on meds  Anxiety Continue Remeron and as needed Ativan.  Patient does not want to take home Paxil    DVT prophylaxis: Lovenox  Consults: none  Advance Care Planning:   Code Status: Full Code   Family Communication: none  Disposition Plan: Back to previous home environment  Severity of Illness: The appropriate patient status for this patient is INPATIENT. Inpatient status is judged to be reasonable and necessary in order to provide the required intensity of service to ensure the patient's safety. The patient's presenting symptoms, physical exam findings, and initial radiographic and laboratory data in the context of their chronic comorbidities is felt to place them at high risk for further clinical deterioration. Furthermore, it is not anticipated that the patient will be medically stable for discharge from the hospital within 2 midnights of admission.   * I certify that at the point of  admission it is my clinical judgment that the patient will require inpatient hospital care spanning beyond 2 midnights from the point of admission due to high intensity of service, high risk for further deterioration and high frequency of surveillance required.*  Author: Andris Baumann, MD 09/04/2021 9:37 PM  For on call review [email or web address].

## 2021-09-04 NOTE — ED Triage Notes (Signed)
Patient presents with fever, cough, cp, and sob X1 week. Last took tylenol at noon today. Denies trouble urinating

## 2021-09-04 NOTE — Sepsis Progress Note (Signed)
Code Sepsis protocol being monitored by eLink. 

## 2021-09-04 NOTE — ED Notes (Signed)
Pt incontinent of urine. Pt brief and bed linen changed. Pt assisted back in bad. Clean gown placed on pt. Pt soiled clothes placed into a pt belongings bag. Call light within reach. Pt has no further needs at this time.

## 2021-09-04 NOTE — ED Provider Notes (Signed)
[facility] Provider Note    Event Date/Time   First MD Initiated Contact with Patient 09/04/21 1914     (approximate)  History   Chief Complaint: Fever, Back Pain, and Shortness of Breath  HPI  Gina Lane is a 71 y.o. female with a past medical history of anxiety presents to the emergency department for back pain cough fever and weakness.  According to the patient over the past week or so she has been experiencing a cough.  She states over the past 3 days she now has chest pain when she coughs and has had a fever.  Patient is febrile to 102.4 on arrival.  Patient has been using Tylenol for her fever.  Patient denies any abdominal pain denies any dysuria.  Patient does have a wet sounding cough during my evaluation at which time patient holds her chest due to discomfort while coughing.  Physical Exam   Triage Vital Signs: ED Triage Vitals  Enc Vitals Group     BP 09/04/21 1752 (!) 127/94     Pulse Rate 09/04/21 1752 (!) 136     Resp 09/04/21 1752 (!) 22     Temp 09/04/21 1752 (!) 102.4 F (39.1 C)     Temp Source 09/04/21 1752 Oral     SpO2 09/04/21 1752 94 %     Weight 09/04/21 1753 94 lb (42.6 kg)     Height 09/04/21 1753 4\' 9"  (1.448 m)     Head Circumference --      Peak Flow --      Pain Score 09/04/21 1752 8     Pain Loc --      Pain Edu? --      Excl. in GC? --     Most recent vital signs: Vitals:   09/04/21 1900 09/04/21 1930  BP: 131/68 126/83  Pulse: 97 98  Resp: (!) 24 (!) 33  Temp:    SpO2: 96% 95%    General: Awake, no distress.  CV:  Good peripheral perfusion.  Regular rate and rhythm  Resp:  Moderate tachypnea around 30 breaths/min.  No obvious wheeze rales or rhonchi on exam.  Patient does have an occasional wet sounding cough during my evaluation. Abd:  No distention.  Soft, nontender.  No rebound or guarding. Other:  No lower extremity edema or tenderness.   ED Results / Procedures / Treatments   EKG  EKG  viewed and interpreted by myself shows sinus tachycardia 105 bpm with a narrow QRS, normal axis, normal intervals, no concerning ST changes.  RADIOLOGY  I have personally viewed and interpreted the x-ray images.  Patient appears to have mild opacity in the right lower lobe. Radiology is read the chest x-ray is bilateral pleural effusions.   MEDICATIONS ORDERED IN ED: Medications  lactated ringers infusion (has no administration in time range)  sodium chloride 0.9 % bolus 1,000 mL (1,000 mLs Intravenous New Bag/Given 09/04/21 1856)  metroNIDAZOLE (FLAGYL) IVPB 500 mg (500 mg Intravenous New Bag/Given 09/04/21 1929)  vancomycin (VANCOCIN) IVPB 1000 mg/200 mL premix (has no administration in time range)  acetaminophen (TYLENOL) tablet 650 mg (650 mg Oral Given 09/04/21 1806)  sodium chloride 0.9 % bolus 1,000 mL (1,000 mLs Intravenous New Bag/Given 09/04/21 1814)  aztreonam (AZACTAM) 2 g in sodium chloride 0.9 % 100 mL IVPB (0 g Intravenous Stopped 09/04/21 1928)     IMPRESSION / MDM / ASSESSMENT AND PLAN / ED COURSE  I reviewed the triage vital  signs and the nursing notes.  Patient's presentation is most consistent with acute presentation with potential threat to life or bodily function.  Patient presents to the emergency department for fever cough chest pain and weakness.  Symptoms have been ongoing x1 week although worse over the past 3 days.  On arrival patient noted to be tachypneic with a wet sounding cough tachycardic around 100 bpm and febrile to 102.4 meeting sepsis criteria.  We will start the patient on broad-spectrum antibiotics.  We will check labs, chest x-ray, urine and continue to closely monitor.  Patient had COVID approximately 2 to 3 years ago but does not had it since.  We will check a COVID swab.  Chest x-ray read as pleural effusions but no obvious pneumonia however my evaluation appears to have opacity in the right lower lobe we will proceed with CTA imaging to help rule out PE  and further evaluate chest to see if the patient has pneumonia.  Lab work shows negative COVID/flu.  Chemistry shows mild hyponatremia.  CBC shows moderate leukocytosis of 15,000.  Lactate reassuringly normal at 1.2 and troponin negative.  Patient will require admission to the hospital once her work-up is been completed given her sepsis criteria.  CTA is pending.  CTA negative for PE but does show a large mass.  Patient will be admitted to the hospital service but will require further work-up for the mass to rule out an oncologic process versus pneumonia.  CRITICAL CARE Performed by: Minna Antis   Total critical care time: 30 minutes  Critical care time was exclusive of separately billable procedures and treating other patients.  Critical care was necessary to treat or prevent imminent or life-threatening deterioration.  Critical care was time spent personally by me on the following activities: development of treatment plan with patient and/or surrogate as well as nursing, discussions with consultants, evaluation of patient's response to treatment, examination of patient, obtaining history from patient or surrogate, ordering and performing treatments and interventions, ordering and review of laboratory studies, ordering and review of radiographic studies, pulse oximetry and re-evaluation of patient's condition.   FINAL CLINICAL IMPRESSION(S) / ED DIAGNOSES   Pneumonia Sepsis    Note:  This document was prepared using Dragon voice recognition software and may include unintentional dictation errors.   Minna Antis, MD 09/04/21 2036

## 2021-09-04 NOTE — Assessment & Plan Note (Signed)
Possible postobstructive pneumonia, given left upper lobe mass on CT Continue ceftriaxone and azithromycin (patient got aztreonam, Flagyl and vancomycin for sepsis of unknown source prior to CTA chest result) Supplemental O2 if needed, DuoNebs as needed, antitussives and supportive care Consider ID consult given rapid appearance of left upper lobe heterogeneous mass when compared to prior CT from 07/29/2021

## 2021-09-04 NOTE — Consult Note (Signed)
PHARMACY -  BRIEF ANTIBIOTIC NOTE   Pharmacy has received consult(s) for vancomycin and aztreonam from an ED provider.  The patient's profile has been reviewed for ht/wt/allergies/indication/available labs.    One time order(s) placed for vancomycin 1 g and aztreonam 2 g  Further antibiotics/pharmacy consults should be ordered by admitting physician if indicated.                       Thank you, Derrek Gu, PharmD 09/04/2021  6:33 PM

## 2021-09-04 NOTE — Assessment & Plan Note (Signed)
Not currently on meds.   °

## 2021-09-04 NOTE — Assessment & Plan Note (Signed)
Continue Remeron and as needed Ativan.  Patient does not want to take home Paxil

## 2021-09-04 NOTE — Assessment & Plan Note (Signed)
History of falls and wedge compression fractures

## 2021-09-05 DIAGNOSIS — A419 Sepsis, unspecified organism: Secondary | ICD-10-CM | POA: Diagnosis not present

## 2021-09-05 DIAGNOSIS — J189 Pneumonia, unspecified organism: Secondary | ICD-10-CM

## 2021-09-05 DIAGNOSIS — R918 Other nonspecific abnormal finding of lung field: Secondary | ICD-10-CM

## 2021-09-05 LAB — URINALYSIS, COMPLETE (UACMP) WITH MICROSCOPIC
Bilirubin Urine: NEGATIVE
Glucose, UA: NEGATIVE mg/dL
Hgb urine dipstick: NEGATIVE
Ketones, ur: 20 mg/dL — AB
Leukocytes,Ua: NEGATIVE
Nitrite: NEGATIVE
Protein, ur: NEGATIVE mg/dL
Specific Gravity, Urine: 1.017 (ref 1.005–1.030)
pH: 5 (ref 5.0–8.0)

## 2021-09-05 LAB — PROCALCITONIN: Procalcitonin: 0.22 ng/mL

## 2021-09-05 LAB — CBC
HCT: 34.2 % — ABNORMAL LOW (ref 36.0–46.0)
Hemoglobin: 11.2 g/dL — ABNORMAL LOW (ref 12.0–15.0)
MCH: 29.6 pg (ref 26.0–34.0)
MCHC: 32.7 g/dL (ref 30.0–36.0)
MCV: 90.2 fL (ref 80.0–100.0)
Platelets: 287 10*3/uL (ref 150–400)
RBC: 3.79 MIL/uL — ABNORMAL LOW (ref 3.87–5.11)
RDW: 13.2 % (ref 11.5–15.5)
WBC: 13.3 10*3/uL — ABNORMAL HIGH (ref 4.0–10.5)
nRBC: 0 % (ref 0.0–0.2)

## 2021-09-05 LAB — BASIC METABOLIC PANEL
Anion gap: 5 (ref 5–15)
BUN: <5 mg/dL — ABNORMAL LOW (ref 8–23)
CO2: 25 mmol/L (ref 22–32)
Calcium: 8.1 mg/dL — ABNORMAL LOW (ref 8.9–10.3)
Chloride: 104 mmol/L (ref 98–111)
Creatinine, Ser: 0.55 mg/dL (ref 0.44–1.00)
GFR, Estimated: >60 mL/min (ref >60)
Glucose, Bld: 105 mg/dL — ABNORMAL HIGH (ref 70–99)
Potassium: 3.3 mmol/L — ABNORMAL LOW (ref 3.5–5.1)
Sodium: 134 mmol/L — ABNORMAL LOW (ref 135–145)

## 2021-09-05 LAB — SURGICAL PCR SCREEN
MRSA, PCR: POSITIVE — AB
Staphylococcus aureus: POSITIVE — AB

## 2021-09-05 LAB — SEDIMENTATION RATE: Sed Rate: 60 mm/hr — ABNORMAL HIGH (ref 0–30)

## 2021-09-05 MED ORDER — ASCORBIC ACID 500 MG PO TABS
1000.0000 mg | ORAL_TABLET | Freq: Every day | ORAL | Status: DC
  Administered 2021-09-05 – 2021-09-06 (×2): 1000 mg via ORAL
  Filled 2021-09-05 (×2): qty 2

## 2021-09-05 MED ORDER — BENZONATATE 100 MG PO CAPS
200.0000 mg | ORAL_CAPSULE | Freq: Three times a day (TID) | ORAL | Status: DC
  Administered 2021-09-05 – 2021-09-06 (×4): 200 mg via ORAL
  Filled 2021-09-05 (×4): qty 2

## 2021-09-05 MED ORDER — KETOROLAC TROMETHAMINE 15 MG/ML IJ SOLN
15.0000 mg | Freq: Four times a day (QID) | INTRAMUSCULAR | Status: DC
  Administered 2021-09-05 – 2021-09-06 (×5): 15 mg via INTRAVENOUS
  Filled 2021-09-05 (×5): qty 1

## 2021-09-05 MED ORDER — GUAIFENESIN-DM 100-10 MG/5ML PO SYRP
5.0000 mL | ORAL_SOLUTION | ORAL | Status: DC | PRN
  Administered 2021-09-06: 5 mL via ORAL
  Filled 2021-09-05: qty 10

## 2021-09-05 NOTE — Progress Notes (Signed)
PROGRESS NOTE    Gina Lane  ELT:[id number] DOB: April 02, 1950 DOA: 09/04/2021 PCP: Rutherford Limerick, PA    Brief Narrative:  71 y.o. female with medical history significant for Anxiety, hypothyroidism and osteoporosis, who was seen in the ED on 07/29/2021 for anterior chest wall pain with CTA chest was negative except for thoracic vertebral compression fractures, who who presents to the ED with a 1 week history of cough, fever, back pain and weakness.  Over the past 3 days she started having chest pain on coughing.  She denies shortness of breath, palpitations.  Denies nausea, vomiting or abdominal pain.  Denies affected contacts.  CTA chest without evidence of pulmonary embolism but however did show a 3 x 3 x 3 left upper lobe mass.  Rapidly developing.  Primary differential is infectious but unable to exclude malignancy at this time.  Pulmonology engaged for recommendations 7/4.   Assessment & Plan:   Principal Problem:   Sepsis ([place]) Active Problems:   CAP (community acquired pneumonia)   Mass of upper lobe of left lung   Anxiety   Acquired hypothyroidism   Age-related osteoporosis without current pathological fracture  Community-acquired pneumonia Sepsis secondary to above Sepsis criteria met with fever, tachycardia, tachypnea, leukocytosis Patient hemodynamically stable Still with some cough Plan: Continue CAP coverage with Rocephin and azithromycin Infectious work-up ordered and pending including Legionella, strep, respiratory culture, Fungitell IV fluids Monitor vitals and fever curve If patient remains hemodynamically stable and on room air consider discharge on 7/5 with outpatient follow-up with pulmonology  Left upper lobe mass 3 cm, noted on CTA chest Was not present on previous CAT scan Rapid development speaks more towards an infectious origin Patient with unclear history but does endorse night sweats and fevers Plan: Treatment as above Infectious work-up as  above Pulmonology engagement, recommendations appreciated If stable as above can consider discharge home on 7/5 with outpatient follow-up with pulmonology  Osteoporosis History of falls and compression wedge fractures Engage PT and OT  Anxiety PTA Remeron and as needed Ativan    DVT prophylaxis: SQ Lovenox Code Status: Full Family Communication:Patients spouse [phone] Disposition Plan: Status is: Inpatient Remains inpatient appropriate because: Community-acquired pneumonia, left upper lobe mass of unclear etiology.  On IV antibiotics.  If clinically improve consider discharge 7/5.   Level of care: Telemetry Medical  Consultants:  Pulmonology  Procedures:  None    Antimicrobials: Ceftriaxone  Azithromycin   Subjective: Seen and examined.  Continues to endorse cough and resultant chest wall pain  Objective: Vitals:   09/05/21 0505 09/05/21 0600 09/05/21 0903 09/05/21 1200  BP: (!) 150/80 131/61 110/66 105/70  Pulse: 95 90 88 89  Resp: 20 (!) _0 Temp: 99.7 F (37.6 C)   98.2 F (36.8 C)  TempSrc: Oral   Oral  SpO2: 97% 94% 96% 98%  Weight:      Height:       No intake or output data in the 24 hours ending 09/05/21 1316 Filed Weights   09/04/21 1753  Weight: 42.6 kg    Examination:  General exam: NAD Respiratory system: Scattered crackles, worse on left, normal work of breathing, 2 L Cardiovascular system: S1-S2, RRR, no murmurs, no pedal edema Gastrointestinal system: Soft, NT/ND, normal bowel sounds Central nervous system: Alert and oriented. No focal neurological deficits. Extremities: Symmetric 5 x 5 power. Skin: No rashes, lesions or ulcers Psychiatry: Judgement and insight appear normal. Mood & affect appropriate.     Data Reviewed:  I have personally reviewed following labs and imaging studies  CBC: Recent Labs  Lab 09/04/21 1803 09/05/21 0508  WBC 15.2* 13.3*  NEUTROABS 13.2*  --   HGB 13.3 11.2*  HCT 40.1 34.2*  MCV 89.1  90.2  PLT 304 161   Basic Metabolic Panel: Recent Labs  Lab 09/04/21 1803 09/05/21 0508  NA 129* 134*  K 3.7 3.3*  CL 92* 104  CO2 23 25  GLUCOSE 122* 105*  BUN 9 <5*  CREATININE 0.64 0.55  CALCIUM 9.0 8.1*   GFR: Estimated Creatinine Clearance: 38.2 mL/min (by C-G formula based on SCr of 0.55 mg/dL). Liver Function Tests: Recent Labs  Lab 09/04/21 1803  AST 26  ALT 21  ALKPHOS 101  BILITOT 0.8  PROT 7.9  ALBUMIN 3.8   No results for input(s): "LIPASE", "AMYLASE" in the last 168 hours. No results for input(s): "AMMONIA" in the last 168 hours. Coagulation Profile: Recent Labs  Lab 09/04/21 1827  INR 1.1   Cardiac Enzymes: No results for input(s): "CKTOTAL", "CKMB", "CKMBINDEX", "TROPONINI" in the last 168 hours. BNP (last 3 results) No results for input(s): "PROBNP" in the last 8760 hours. HbA1C: No results for input(s): "HGBA1C" in the last 72 hours. CBG: No results for input(s): "GLUCAP" in the last 168 hours. Lipid Profile: No results for input(s): "CHOL", "HDL", "LDLCALC", "TRIG", "CHOLHDL", "LDLDIRECT" in the last 72 hours. Thyroid Function Tests: No results for input(s): "TSH", "T4TOTAL", "FREET4", "T3FREE", "THYROIDAB" in the last 72 hours. Anemia Panel: No results for input(s): "VITAMINB12", "FOLATE", "FERRITIN", "TIBC", "IRON", "RETICCTPCT" in the last 72 hours. Sepsis Labs: Recent Labs  Lab 09/04/21 1803 09/04/21 1949 09/05/21 1215  PROCALCITON  --   --  0.22  LATICACIDVEN 1.2 0.8  --     Recent Results (from the past 240 hour(s))  Resp Panel by RT-PCR (Flu A&B, Covid) Anterior Nasal Swab     Status: None   Collection Time: 09/04/21  6:03 PM   Specimen: Anterior Nasal Swab  Result Value Ref Range Status   SARS Coronavirus 2 by RT PCR NEGATIVE NEGATIVE Final    Comment: (NOTE) SARS-CoV-2 target nucleic acids are NOT DETECTED.  The SARS-CoV-2 RNA is generally detectable in upper respiratory specimens during the acute phase of infection.  The lowest concentration of SARS-CoV-2 viral copies this assay can detect is 138 copies/mL. A negative result does not preclude SARS-Cov-2 infection and should not be used as the sole basis for treatment or other patient management decisions. A negative result may occur with  improper specimen collection/handling, submission of specimen other than nasopharyngeal swab, presence of viral mutation(s) within the areas targeted by this assay, and inadequate number of viral copies(<138 copies/mL). A negative result must be combined with clinical observations, patient history, and epidemiological information. The expected result is Negative.  Fact Sheet for Patients:  EntrepreneurPulse.com.au  Fact Sheet for Healthcare Providers:  IncredibleEmployment.be  This test is no t yet approved or cleared by the [place] FDA and  has been authorized for detection and/or diagnosis of SARS-CoV-2 by FDA under an Emergency Use Authorization (EUA). This EUA will remain  in effect (meaning this test can be used) for the duration of the COVID-19 declaration under Section 564(b)(1) of the Act, 21 U.S.C.section 360bbb-3(b)(1), unless the authorization is terminated  or revoked sooner.       Influenza A by PCR NEGATIVE NEGATIVE Final   Influenza B by PCR NEGATIVE NEGATIVE Final    Comment: (NOTE) The Xpert Xpress SARS-CoV-2/FLU/RSV plus  assay is intended as an aid in the diagnosis of influenza from Nasopharyngeal swab specimens and should not be used as a sole basis for treatment. Nasal washings and aspirates are unacceptable for Xpert Xpress SARS-CoV-2/FLU/RSV testing.  Fact Sheet for Patients: EntrepreneurPulse.com.au  Fact Sheet for Healthcare Providers: IncredibleEmployment.be  This test is not yet approved or cleared by the [place] FDA and has been authorized for detection and/or diagnosis of SARS-CoV-2 by FDA  under an Emergency Use Authorization (EUA). This EUA will remain in effect (meaning this test can be used) for the duration of the COVID-19 declaration under Section 564(b)(1) of the Act, 21 U.S.C. section 360bbb-3(b)(1), unless the authorization is terminated or revoked.  Performed at [facility], [address] [place], [place] [address]   Culture, blood (routine x 2)     Status: None (Preliminary result)   Collection Time: 09/04/21  6:03 PM   Specimen: BLOOD  Result Value Ref Range Status   Specimen Description BLOOD RIGHT ANTECUBITAL  Final   Special Requests   Final    BOTTLES DRAWN AEROBIC AND ANAEROBIC Blood Culture adequate volume   Culture   Final    NO GROWTH < 24 HOURS Performed at [facility], [address], [place], [place] [address]    Report Status PENDING  Incomplete  Culture, blood (routine x 2)     Status: None (Preliminary result)   Collection Time: 09/04/21  6:54 PM   Specimen: BLOOD  Result Value Ref Range Status   Specimen Description BLOOD RIGHT ANTECUBITAL  Final   Special Requests   Final    BOTTLES DRAWN AEROBIC AND ANAEROBIC Blood Culture adequate volume   Culture   Final    NO GROWTH < 12 HOURS Performed at [facility], [address] [place], [place] [address]    Report Status PENDING  Incomplete         Radiology Studies: CT Angio Chest PE W and/or Wo Contrast  Result Date: 09/04/2021 CLINICAL DATA:  Fever, cough and chest pain. EXAM: CT ANGIOGRAPHY CHEST WITH CONTRAST TECHNIQUE: Multidetector CT imaging of the chest was performed using the standard protocol during bolus administration of intravenous contrast. Multiplanar CT image reconstructions and MIPs were obtained to evaluate the vascular anatomy. RADIATION DOSE REDUCTION: This exam was performed according to the departmental dose-optimization program which includes automated exposure control, adjustment of the mA and/or kV according to patient size and/or  use of iterative reconstruction technique. CONTRAST:  25m OMNIPAQUE IOHEXOL 350 MG/ML SOLN COMPARISON:  Jul 29, 2021 FINDINGS: Cardiovascular: There is mild calcification of the aortic arch without evidence of aortic aneurysmal dissection. Satisfactory opacification of the pulmonary arteries to the segmental level. No evidence of pulmonary embolism. Normal heart size with mild coronary artery calcification. No pericardial effusion. Mediastinum/Nodes: A 1.5 cm precarinal lymph node is seen. 1.1 cm and 1.3 cm subcarinal lymph nodes are also noted. Thyroid gland, trachea, and esophagus demonstrate no significant findings. Lungs/Pleura: A 3.1 cm x 3.8 cm x 3.8 cm heterogeneous mass is seen within the anteromedial aspect of the left upper lobe. This is located just above the aortic arch and represents a new finding when compared to the prior study. A mild amount of surrounding atelectasis is noted. Mild to moderate severity atelectasis is also seen within the posterior aspects of the bilateral lower lobes. Mild bronchiectasis is also seen within these regions with mild mucous plugging. There are small bilateral pleural effusions. No pneumothorax is identified. Upper Abdomen: No  acute abnormality. Musculoskeletal: Exaggerated kyphosis of the thoracic spine is seen with chronic compression fracture deformities noted at the levels of T9, T11 and L1. Review of the MIP images confirms the above findings. IMPRESSION: 1. No evidence of pulmonary embolism. 2. 3.1 cm x 3.8 cm x 3.8 cm left upper lobe mass. Despite its rapid development since the prior study, there is concerning for malignancy. Further evaluation with nuclear medicine PET/CT or tissue sampling is recommended. Reference: Radiology. 2017; 284(1):228-43 3. Small bilateral pleural effusions with mild to moderate severity posterior bilateral lower lobe atelectasis. 4. Enlarged precarinal and subcarinal lymph nodes which may be, in part, reactive. Aortic  Atherosclerosis (ICD10-I70.0). Electronically Signed   By: Virgina Norfolk M.D.   On: 09/04/2021 20:31   DG Chest 2 View  Result Date: 09/04/2021 CLINICAL DATA:  Cough and fever EXAM: CHEST - 2 VIEW COMPARISON:  07/29/2021 FINDINGS: Small bilateral pleural effusions. No focal consolidation. Stable cardiomediastinal silhouette. No pneumothorax. Multiple compression deformities of the mid to lower thoracic spine IMPRESSION: Interval small bilateral pleural effusion Electronically Signed   By: Donavan Foil M.D.   On: 09/04/2021 18:42        Scheduled Meds:  vitamin C  1,000 mg Oral Daily   benzonatate  200 mg Oral TID   enoxaparin (LOVENOX) injection  30 mg Subcutaneous Q24H   ketorolac  15 mg Intravenous Q6H   mirtazapine  30 mg Oral QHS   Continuous Infusions:  azithromycin Stopped (09/05/21 0952)   cefTRIAXone (ROCEPHIN)  IV Stopped (09/05/21 0817)   lactated ringers 75 mL/hr at 09/05/21 0835     LOS: 1 day      Sidney Ace, MD [facility]   If 7PM-7AM, please contact night-coverage  09/05/2021, 1:16 PM

## 2021-09-05 NOTE — ED Notes (Signed)
Informed RN bed assigned 

## 2021-09-05 NOTE — Consult Note (Signed)
PULMONOLOGY         Date: 09/05/2021,   MRN# [id number] Gina Lane 1950/11/18     AdmissionWeight: 42.6 kg                 CurrentWeight: 42.6 kg  Referring provider: Dr Priscella Mann   CHIEF COMPLAINT:   Pneumonia with possible lung mass on CT chest    HISTORY OF PRESENT ILLNESS   Gina Lane is a 71 y.o. female with medical history significant for , chronic malnutrition, Anxiety, recurrent falls, hypothyroidism and osteoporosis, who was seen in the ED on 07/29/2021 for anterior chest wall pain with CTA chest was negative except for thoracic vertebral compression fractures, who who presents to the ED with a 1 week history of cough, fever, back pain and weakness.  Over the past 3 days she started having chest pain on coughing.  She denies shortness of breath, palpitations.  Denies nausea, vomiting or abdominal pain.  Denies affected contacts.On arrival temperature 102.4 with pulse 136.  Respirations 22-36 with O2 sat 94 to 97% on room air.  BP 127/94.   She reports total of 3 wk of malaise, chest discomfort and cough.  Labs with elevated wbc count otherwise normal. CT chest which was reviewed by me showing spiculated Left upper lobe consolidation with possible mass. Patient is currently being treated for sepsis, pneumonia with Rocephin IV and Azactam & Zithromax as well is IVF at 75cc/hr with LR.   PAST MEDICAL HISTORY   Past Medical History:  Diagnosis Date   Anxiety    Osteoporosis      SURGICAL HISTORY   History reviewed. No pertinent surgical history.   FAMILY HISTORY   History reviewed. No pertinent family history.   SOCIAL HISTORY   Social History   Tobacco Use   Smoking status: Never   Smokeless tobacco: Never  Vaping Use   Vaping Use: Never used  Substance Use Topics   Alcohol use: Yes    Alcohol/week: 7.0 standard drinks of alcohol    Types: 7 Glasses of wine per week   Drug use: Never     MEDICATIONS    Home Medication:   Current Outpatient Rx   Order #: [id number] Class: Historical Med   Order #: [id number] Class: Historical Med   Order #: [id number] Class: Historical Med   Order #: [id number] Class: Historical Med   Order #: [id number] Class: Historical Med   Order #: [id number] Class: Historical Med    Current Medication:  Current Facility-Administered Medications:    acetaminophen (TYLENOL) tablet 650 mg, 650 mg, Oral, Q6H PRN, 650 mg at 09/05/21 0739 **OR** acetaminophen (TYLENOL) suppository 650 mg, 650 mg, Rectal, Q6H PRN, Athena Masse, MD   ascorbic acid (VITAMIN C) tablet 1,000 mg, 1,000 mg, Oral, Daily, Sreenath, Sudheer B, MD, 1,000 mg at 09/05/21 1013   azithromycin (ZITHROMAX) 500 mg in sodium chloride 0.9 % 250 mL IVPB, 500 mg, Intravenous, Q24H, Athena Masse, MD, Stopped at 09/05/21 8299   benzonatate (TESSALON) capsule 200 mg, 200 mg, Oral, TID, Sreenath, Sudheer B, MD, 200 mg at 09/05/21 1013   cefTRIAXone (ROCEPHIN) 2 g in sodium chloride 0.9 % 100 mL IVPB, 2 g, Intravenous, Q24H, Athena Masse, MD, Stopped at 09/05/21 0817   enoxaparin (LOVENOX) injection 30 mg, 30 mg, Subcutaneous, Q24H, Judd Gaudier V, MD, 30 mg at 09/05/21 0000   ketorolac (TORADOL) 15 MG/ML injection 15 mg, 15 mg, Intravenous, Q6H, Sreenath, Sudheer B, MD, 15 mg at 09/05/21 [phone]  lactated ringers infusion, , Intravenous, Continuous, Sreenath, Sudheer B, MD, Last Rate: 75 mL/hr at 09/05/21 0835, Rate Change at 09/05/21 0835   LORazepam (ATIVAN) tablet 0.5 mg, 0.5 mg, Oral, Q8H PRN, Judd Gaudier V, MD, 0.5 mg at 09/05/21 0740   mirtazapine (REMERON) tablet 30 mg, 30 mg, Oral, QHS, Judd Gaudier V, MD, 30 mg at 09/05/21 0000   ondansetron (ZOFRAN) tablet 4 mg, 4 mg, Oral, Q6H PRN **OR** ondansetron (ZOFRAN) injection 4 mg, 4 mg, Intravenous, Q6H PRN, Athena Masse, MD  Current Outpatient Medications:    Ascorbic Acid (VITAMIN C) 1000 MG tablet, Take 1,000 mg by mouth daily., Disp: , Rfl:    LORazepam (ATIVAN) 0.5 MG  tablet, Take 0.5 mg by mouth every 8 (eight) hours as needed., Disp: , Rfl:    PARoxetine (PAXIL) 10 MG tablet, SMARTSIG:1 Tablet(s) By Mouth Every Evening, Disp: , Rfl:    vitamin B-12 (CYANOCOBALAMIN) 500 MCG tablet, Take 500 mcg by mouth daily., Disp: , Rfl:    mirtazapine (REMERON) 30 MG tablet, Take 30 mg by mouth at bedtime., Disp: , Rfl:    triamcinolone cream (KENALOG) 0.1 %, Apply topically 2 (two) times daily as needed., Disp: , Rfl:     ALLERGIES   Penicillins     REVIEW OF SYSTEMS    Review of Systems:  Gen:  Denies  fever, sweats, chills weigh loss  HEENT: Denies blurred vision, double vision, ear pain, eye pain, hearing loss, nose bleeds, sore throat Cardiac:  No dizziness, chest pain or heaviness, chest tightness,edema Resp:   reports dyspnea chronically  Gi: Denies swallowing difficulty, stomach pain, nausea or vomiting, diarrhea, constipation, bowel incontinence Gu:  Denies bladder incontinence, burning urine Ext:   Denies Joint pain, stiffness or swelling Skin: Denies  skin rash, easy bruising or bleeding or hives Endoc:  Denies polyuria, polydipsia , polyphagia or weight change Psych:   Denies depression, insomnia or hallucinations   Other:  All other systems negative   VS: BP 110/66 (BP Location: Right Arm)   Pulse 88   Temp 99.7 F (37.6 C) (Oral)   Resp 16   Ht $R'4\' 9"'Ns$  (1.448 m)   Wt 42.6 kg   SpO2 96%   BMI 20.34 kg/m      PHYSICAL EXAM    GENERAL:NAD, no fevers, chills, no weakness no fatigue HEAD: Normocephalic, atraumatic.  EYES: Pupils equal, round, reactive to light. Extraocular muscles intact. No scleral icterus.  MOUTH: Moist mucosal membrane. Dentition intact. No abscess noted.  EAR, NOSE, THROAT: Clear without exudates. No external lesions.  NECK: Supple. No thyromegaly. No nodules. No JVD.  PULMONARY: decreased breath sounds with mild rhonchi worse at bases bilaterally.  CARDIOVASCULAR: S1 and S2. Regular rate and rhythm. No  murmurs, rubs, or gallops. No edema. Pedal pulses 2+ bilaterally.  GASTROINTESTINAL: Soft, nontender, nondistended. No masses. Positive bowel sounds. No hepatosplenomegaly.  MUSCULOSKELETAL: No swelling, clubbing, or edema. Range of motion full in all extremities.  NEUROLOGIC: Cranial nerves II through XII are intact. No gross focal neurological deficits. Sensation intact. Reflexes intact.  SKIN: No ulceration, lesions, rashes, or cyanosis. Skin warm and dry. Turgor intact.  PSYCHIATRIC: Mood, affect within normal limits. The patient is awake, alert and oriented x 3. Insight, judgment intact.       IMAGING     ASSESSMENT/PLAN   Acute community acquired pneumonia of left upper lobe  - present on admission  - COVID19 negative - last + 3 years ago - mild case   -  supplemental O2 during my evaluation -room air -unlikely viral -serum fungitell in process  -legionella ab -strep pneumoniae ur AG -Histoplasma Ur Ag -sputum resp cultures -reviewed pertinent imaging with patient today - ESR -please encourage patient to use incentive spirometer few times each hour while hospitalized.     Left upper lobe mass   - possible infectious only    - patient never smoker with no family or personal history of neoplasia.    - Low pretest probability of malignancy    - she does not vaccinate against [phone]   -she had covid mild case in 2020            Thank you for allowing me to participate in the care of this patient.   Patient/Family are satisfied with care plan and all questions have been answered.    Provider disclosure: Patient with at least one acute or chronic illness or injury that poses a threat to life or bodily function and is being managed actively during this encounter.  All of the below services have been performed independently by signing provider:  review of prior documentation from internal and or external health records.  Review of previous and current lab results.   Interview and comprehensive assessment during patient visit today. Review of current and previous chest radiographs/CT scans. Discussion of management and test interpretation with health care team and patient/family.   This document was prepared using Dragon voice recognition software and may include unintentional dictation errors.     Ottie Glazier, M.D.  Division of Pulmonary & Critical Care Medicine

## 2021-09-05 NOTE — ED Notes (Signed)
Assisted patient up to the bedside commode. Patient changed into a new gown and bed pads.

## 2021-09-05 NOTE — ED Notes (Signed)
Morning labs drawn from pt at this time.  Pt given pillow and more warm blankets for comfort.  Pt states she does not have to use restroom at this time.  Pt denies any other needs at this time.

## 2021-09-06 ENCOUNTER — Encounter: Payer: Self-pay | Admitting: Internal Medicine

## 2021-09-06 DIAGNOSIS — E871 Hypo-osmolality and hyponatremia: Secondary | ICD-10-CM | POA: Diagnosis present

## 2021-09-06 DIAGNOSIS — J189 Pneumonia, unspecified organism: Secondary | ICD-10-CM | POA: Diagnosis not present

## 2021-09-06 DIAGNOSIS — E876 Hypokalemia: Secondary | ICD-10-CM | POA: Diagnosis not present

## 2021-09-06 DIAGNOSIS — A419 Sepsis, unspecified organism: Secondary | ICD-10-CM | POA: Diagnosis not present

## 2021-09-06 LAB — CBC WITH DIFFERENTIAL/PLATELET
Abs Immature Granulocytes: 0.11 10*3/uL — ABNORMAL HIGH (ref 0.00–0.07)
Basophils Absolute: 0.1 10*3/uL (ref 0.0–0.1)
Basophils Relative: 1 %
Eosinophils Absolute: 0.3 10*3/uL (ref 0.0–0.5)
Eosinophils Relative: 2 %
HCT: 34.3 % — ABNORMAL LOW (ref 36.0–46.0)
Hemoglobin: 11.3 g/dL — ABNORMAL LOW (ref 12.0–15.0)
Immature Granulocytes: 1 %
Lymphocytes Relative: 9 %
Lymphs Abs: 1.1 10*3/uL (ref 0.7–4.0)
MCH: 29.6 pg (ref 26.0–34.0)
MCHC: 32.9 g/dL (ref 30.0–36.0)
MCV: 89.8 fL (ref 80.0–100.0)
Monocytes Absolute: 1.1 10*3/uL — ABNORMAL HIGH (ref 0.1–1.0)
Monocytes Relative: 9 %
Neutro Abs: 9.4 10*3/uL — ABNORMAL HIGH (ref 1.7–7.7)
Neutrophils Relative %: 78 %
Platelets: 347 10*3/uL (ref 150–400)
RBC: 3.82 MIL/uL — ABNORMAL LOW (ref 3.87–5.11)
RDW: 13.5 % (ref 11.5–15.5)
WBC: 12.1 10*3/uL — ABNORMAL HIGH (ref 4.0–10.5)
nRBC: 0 % (ref 0.0–0.2)

## 2021-09-06 LAB — STREP PNEUMONIAE URINARY ANTIGEN: Strep Pneumo Urinary Antigen: POSITIVE — AB

## 2021-09-06 LAB — BASIC METABOLIC PANEL
Anion gap: 11 (ref 5–15)
BUN: 5 mg/dL — ABNORMAL LOW (ref 8–23)
CO2: 19 mmol/L — ABNORMAL LOW (ref 22–32)
Calcium: 7.9 mg/dL — ABNORMAL LOW (ref 8.9–10.3)
Chloride: 101 mmol/L (ref 98–111)
Creatinine, Ser: 0.68 mg/dL (ref 0.44–1.00)
GFR, Estimated: >60 mL/min (ref >60)
Glucose, Bld: 194 mg/dL — ABNORMAL HIGH (ref 70–99)
Potassium: 3.2 mmol/L — ABNORMAL LOW (ref 3.5–5.1)
Sodium: 131 mmol/L — ABNORMAL LOW (ref 135–145)

## 2021-09-06 LAB — LEGIONELLA PNEUMOPHILA SEROGP 1 UR AG: L. pneumophila Serogp 1 Ur Ag: NEGATIVE

## 2021-09-06 LAB — C-REACTIVE PROTEIN: CRP: 18.1 mg/dL — ABNORMAL HIGH (ref <1.0)

## 2021-09-06 LAB — URINE CULTURE: Culture: NO GROWTH

## 2021-09-06 MED ORDER — LEVOFLOXACIN 750 MG PO TABS: 750.0000 mg | ORAL_TABLET | Freq: Every day | ORAL | 0 refills | Status: AC

## 2021-09-06 MED ORDER — POTASSIUM CHLORIDE CRYS ER 20 MEQ PO TBCR
40.0000 meq | EXTENDED_RELEASE_TABLET | Freq: Once | ORAL | Status: AC
Start: 2021-09-06 — End: 2021-09-06
  Administered 2021-09-06: 40 meq via ORAL
  Filled 2021-09-06: qty 2

## 2021-09-06 MED ORDER — GUAIFENESIN-DM 100-10 MG/5ML PO SYRP
5.0000 mL | ORAL_SOLUTION | Freq: Four times a day (QID) | ORAL | 0 refills | Status: AC | PRN
Start: 2021-09-06 — End: ?

## 2021-09-06 MED ORDER — MUPIROCIN 2 % EX OINT
1.0000 | TOPICAL_OINTMENT | Freq: Two times a day (BID) | CUTANEOUS | Status: DC
  Administered 2021-09-06: 1 via NASAL
  Filled 2021-09-06: qty 22

## 2021-09-06 MED ORDER — CHLORHEXIDINE GLUCONATE CLOTH 2 % EX PADS: 6.0000 | MEDICATED_PAD | Freq: Every day | CUTANEOUS | Status: DC

## 2021-09-06 NOTE — Evaluation (Signed)
Occupational Therapy Evaluation Patient Details Name: Gina Lane MRN: [id number] DOB: 03/29/1950 Today's Date: 09/06/2021   History of Present Illness Patient is a 71 year old female with 1 week history of cough, fever, back pain and weakness. Found to have sepsis with community aquired pneumonia. Chest pain on coughing with left upper lobe mass found with CTA chest.  History of falls and compression wedge fractures, anxiety, osteoporosis.   Clinical Impression   Pt was seen for OT evaluation this date. Prior to hospital admission, pt was using [facility] for functional mobility in home and rollator for community distances and requires assistance with LBD and lower body bathing 2/2 back pain. Pt lives with spouse in a one level house with 3 steps to enter. Pt reported generalized pain in back and ribs. Pt presents to acute OT demonstrating impaired ADL performance and functional mobility 2/2 decreased activity tolerance and functional strength/ROM/balance deficits.   Pt currently requires SUPERVISION with HOB elevated for bed mobility and STS - pt is familiar with log roll and able to demonstrate. SUPERVISION + RW with min vcs for safe hand placement for toilet t/f. SUPERVISION with single UE support on grab bar for pericare in standing. MIN A for grooming tasks with in intermittent single UE support on sink in standing with 1 seated rest break - needs assistance with brushing back of head and braiding hair, pt reported standing while reaching up to brush hair causes rib pain/discomfort. MOD I for lower body dressing and donning/doffing socks with AD while seated. Pt left in bed with all needs met. Pt would benefit from skilled OT to address noted impairments and functional limitations (see below for any additional details). Upon hospital discharge, recommend HHOT to maximize pt safety and return to PLOF.      Recommendations for follow up therapy are one component of a multi-disciplinary discharge  planning process, led by the attending physician.  Recommendations may be updated based on patient status, additional functional criteria and insurance authorization.   Follow Up Recommendations  Home health OT    Assistance Recommended at Discharge Set up Supervision/Assistance  Patient can return home with the following A little help with walking and/or transfers;A lot of help with bathing/dressing/bathroom;Assistance with cooking/housework;Assist for transportation    Functional Status Assessment  Patient has had a recent decline in their functional status and demonstrates the ability to make significant improvements in function in a reasonable and predictable amount of time.  Equipment Recommendations  Other (comment) (per next venue of care)    Recommendations for Other Services       Precautions / Restrictions Precautions Precautions: Fall Precaution Comments: history of wedge compression fractures and pain with trunk flexion Restrictions Weight Bearing Restrictions: No      Mobility Bed Mobility Overal bed mobility: Needs Assistance Bed Mobility: Supine to Sit, Sit to Supine     Supine to sit: Supervision, HOB elevated Sit to supine: Supervision, HOB elevated   General bed mobility comments: increased time required with no physical assistance needed    Transfers Overall transfer level: Needs assistance Equipment used: Rolling walker (2 wheels) Transfers: Sit to/from Stand Sit to Stand: Supervision                  Balance Overall balance assessment: Needs assistance Sitting-balance support: Feet supported, No upper extremity supported Sitting balance-Leahy Scale: Good     Standing balance support: During functional activity, Single extremity supported Standing balance-Leahy Scale: Fair Standing balance comment: able to stand  and perform pericare with single UE support                           ADL either performed or assessed with  clinical judgement   ADL Overall ADL's : Needs assistance/impaired                                       General ADL Comments: SUPERVISION + RW with min vcs for safe hand placement for toilet t/f. SUPERVISION with single UE support on grab bar for pericare in standing. MIN A for grooming tasks in standing with 1 seated rest break - needs assistance with brushing back of head and braiding hair, pt reported standing causes rib pain. MOD I for lower body dressing and donning/doffing socks with AD while seated.      Pertinent Vitals/Pain Pain Assessment Pain Assessment: Faces Faces Pain Scale: Hurts a little bit Pain Location: back Pain Descriptors / Indicators: Discomfort, Sore Pain Intervention(s): Limited activity within patient's tolerance, Monitored during session, Repositioned     Hand Dominance     Extremity/Trunk Assessment Upper Extremity Assessment Upper Extremity Assessment: Generalized weakness   Lower Extremity Assessment Lower Extremity Assessment: Generalized weakness       Communication Communication Communication: No difficulties   Cognition Arousal/Alertness: Awake/alert Behavior During Therapy: WFL for tasks assessed/performed Overall Cognitive Status: Within Functional Limits for tasks assessed                                                  Home Living Family/patient expects to be discharged to:: Private residence Living Arrangements: Spouse/significant other Available Help at Discharge: Family Type of Home: House Home Access: Stairs to enter Technical brewer of Steps: 3   Home Layout: One level     Bathroom Shower/Tub: Sponge bathes at baseline   Bathroom Toilet: Handicapped height     Home Equipment: Rollator (4 wheels);Cane - single point          Prior Functioning/Environment Prior Level of Function : Independent/Modified Independent             Mobility Comments: using cane inside,  rollator for the community. intermittent HHA from spouse with walking and stairs ADLs Comments: Needs assistance with LBD and lower body bathing due to back pain        OT Problem List: Decreased strength;Decreased range of motion;Decreased activity tolerance;Impaired balance (sitting and/or standing);Decreased knowledge of use of DME or AE      OT Treatment/Interventions: Self-care/ADL training;Therapeutic exercise;DME and/or AE instruction;Energy conservation;Therapeutic activities;Patient/family education;Balance training    OT Goals(Current goals can be found in the care plan section) Acute Rehab OT Goals Patient Stated Goal: to go home OT Goal Formulation: With patient Time For Goal Achievement: 09/20/21 Potential to Achieve Goals: Good ADL Goals Pt Will Perform Grooming: with supervision;standing;with adaptive equipment (with LRAD) Pt Will Perform Lower Body Bathing: with adaptive equipment;with min assist;sitting/lateral leans Pt Will Transfer to Toilet: with modified independence;ambulating;regular height toilet (with raised height and LRAD)  OT Frequency: Min 2X/week       AM-PAC OT "6 Clicks" Daily Activity     Outcome Measure Help from another person eating meals?: None Help from another person taking care of personal grooming?: A  Little Help from another person toileting, which includes using toliet, bedpan, or urinal?: A Little Help from another person bathing (including washing, rinsing, drying)?: A Lot Help from another person to put on and taking off regular upper body clothing?: A Little Help from another person to put on and taking off regular lower body clothing?: A Lot 6 Click Score: 17   End of Session Equipment Utilized During Treatment: Gait belt;Rolling walker (2 wheels)  Activity Tolerance: Patient tolerated treatment well Patient left: in bed;with call bell/phone within reach;with bed alarm set  OT Visit Diagnosis: Muscle weakness (generalized) (M62.81)                 Time: 1125-1150 OT Time Calculation (min): 25 min Charges:  OT General Charges $OT Visit: 1 Visit OT Evaluation $OT Eval Moderate Complexity: 1 Mod OT Treatments $Self Care/Home Management : 8-22 mins  [employer], OTDS  [employer] 09/06/2021, 1:36 PM

## 2021-09-06 NOTE — Progress Notes (Signed)
PULMONOLOGY         Date: 09/06/2021,   MRN# [id number] Gina Lane [birth year]     AdmissionWeight: 42.6 kg                 CurrentWeight: 42.6 kg  Referring provider: Dr Priscella Mann   CHIEF COMPLAINT:   Pneumonia with possible lung mass on CT chest    HISTORY OF PRESENT ILLNESS   Gina Lane is a 71 y.o. female with medical history significant for , chronic malnutrition, Anxiety, recurrent falls, hypothyroidism and osteoporosis, who was seen in the ED on 07/29/2021 for anterior chest wall pain with CTA chest was negative except for thoracic vertebral compression fractures, who who presents to the ED with a 1 week history of cough, fever, back pain and weakness.  Over the past 3 days she started having chest pain on coughing.  She denies shortness of breath, palpitations.  Denies nausea, vomiting or abdominal pain.  Denies affected contacts.On arrival temperature 102.4 with pulse 136.  Respirations 22-36 with O2 sat 94 to 97% on room air.  BP 127/94.   She reports total of 3 wk of malaise, chest discomfort and cough.  Labs with elevated wbc count otherwise normal. CT chest which was reviewed by me showing spiculated Left upper lobe consolidation with possible mass. Patient is currently being treated for sepsis, pneumonia with Rocephin IV and Azactam & Zithromax as well is IVF at 75cc/hr with LR.  09/06/21- patient is improved and may continue therapy for pna on outpatient with follow up in pulmonary clinic.    PAST MEDICAL HISTORY   Past Medical History:  Diagnosis Date   Anxiety    Osteoporosis      SURGICAL HISTORY   History reviewed. No pertinent surgical history.   FAMILY HISTORY   History reviewed. No pertinent family history.   SOCIAL HISTORY   Social History   Tobacco Use   Smoking status: Never   Smokeless tobacco: Never  Vaping Use   Vaping Use: Never used  Substance Use Topics   Alcohol use: Yes    Alcohol/week: 7.0 standard drinks of  alcohol    Types: 7 Glasses of wine per week   Drug use: Never     MEDICATIONS    Home Medication:     Current Medication:  Current Facility-Administered Medications:    acetaminophen (TYLENOL) tablet 650 mg, 650 mg, Oral, Q6H PRN, 650 mg at 09/06/21 0438 **OR** acetaminophen (TYLENOL) suppository 650 mg, 650 mg, Rectal, Q6H PRN, Athena Masse, MD   ascorbic acid (VITAMIN C) tablet 1,000 mg, 1,000 mg, Oral, Daily, Sreenath, Sudheer B, MD, 1,000 mg at 09/06/21 0805   azithromycin (ZITHROMAX) 500 mg in sodium chloride 0.9 % 250 mL IVPB, 500 mg, Intravenous, Q24H, Judd Gaudier V, MD, Last Rate: 250 mL/hr at 09/06/21 0840, 500 mg at 09/06/21 0840   benzonatate (TESSALON) capsule 200 mg, 200 mg, Oral, TID, Sreenath, Sudheer B, MD, 200 mg at 09/06/21 0805   cefTRIAXone (ROCEPHIN) 2 g in sodium chloride 0.9 % 100 mL IVPB, 2 g, Intravenous, Q24H, Judd Gaudier V, MD, Last Rate: 200 mL/hr at 09/06/21 0758, 2 g at 09/06/21 0758   Chlorhexidine Gluconate Cloth 2 % PADS 6 each, 6 each, Topical, Q0600, Jennye Boroughs, MD   enoxaparin (LOVENOX) injection 30 mg, 30 mg, Subcutaneous, Q24H, Judd Gaudier V, MD, 30 mg at 09/05/21 2131   guaiFENesin-dextromethorphan (ROBITUSSIN DM) 100-10 MG/5ML syrup 5 mL, 5 mL, Oral, Q4H PRN,  Ralene Muskrat B, MD, 5 mL at 09/06/21 0438   ketorolac (TORADOL) 15 MG/ML injection 15 mg, 15 mg, Intravenous, Q6H, Sreenath, Sudheer B, MD, 15 mg at 09/06/21 0553   LORazepam (ATIVAN) tablet 0.5 mg, 0.5 mg, Oral, Q8H PRN, Athena Masse, MD, 0.5 mg at 09/06/21 0746   mirtazapine (REMERON) tablet 30 mg, 30 mg, Oral, QHS, Athena Masse, MD, 30 mg at 09/05/21 2130   mupirocin ointment (BACTROBAN) 2 % 1 Application, 1 Application, Nasal, BID, Jennye Boroughs, MD, 1 Application at 02/25/81 0805   ondansetron (ZOFRAN) tablet 4 mg, 4 mg, Oral, Q6H PRN **OR** ondansetron (ZOFRAN) injection 4 mg, 4 mg, Intravenous, Q6H PRN, Athena Masse, MD    ALLERGIES    Penicillins     REVIEW OF SYSTEMS    Review of Systems:  Gen:  Denies  fever, sweats, chills weigh loss  HEENT: Denies blurred vision, double vision, ear pain, eye pain, hearing loss, nose bleeds, sore throat Cardiac:  No dizziness, chest pain or heaviness, chest tightness,edema Resp:   reports dyspnea chronically  Gi: Denies swallowing difficulty, stomach pain, nausea or vomiting, diarrhea, constipation, bowel incontinence Gu:  Denies bladder incontinence, burning urine Ext:   Denies Joint pain, stiffness or swelling Skin: Denies  skin rash, easy bruising or bleeding or hives Endoc:  Denies polyuria, polydipsia , polyphagia or weight change Psych:   Denies depression, insomnia or hallucinations   Other:  All other systems negative   VS: BP 128/70 (BP Location: Left Arm)   Pulse 87   Temp 98.9 F (37.2 C)   Resp 20   Ht $R'4\' 9"'zD$  (1.448 m)   Wt 42.6 kg   SpO2 98%   BMI 20.34 kg/m      PHYSICAL EXAM    GENERAL:NAD, no fevers, chills, no weakness no fatigue HEAD: Normocephalic, atraumatic.  EYES: Pupils equal, round, reactive to light. Extraocular muscles intact. No scleral icterus.  MOUTH: Moist mucosal membrane. Dentition intact. No abscess noted.  EAR, NOSE, THROAT: Clear without exudates. No external lesions.  NECK: Supple. No thyromegaly. No nodules. No JVD.  PULMONARY: decreased breath sounds with mild rhonchi worse at bases bilaterally.  CARDIOVASCULAR: S1 and S2. Regular rate and rhythm. No murmurs, rubs, or gallops. No edema. Pedal pulses 2+ bilaterally.  GASTROINTESTINAL: Soft, nontender, nondistended. No masses. Positive bowel sounds. No hepatosplenomegaly.  MUSCULOSKELETAL: No swelling, clubbing, or edema. Range of motion full in all extremities.  NEUROLOGIC: Cranial nerves II through XII are intact. No gross focal neurological deficits. Sensation intact. Reflexes intact.  SKIN: No ulceration, lesions, rashes, or cyanosis. Skin warm and dry. Turgor intact.   PSYCHIATRIC: Mood, affect within normal limits. The patient is awake, alert and oriented x 3. Insight, judgment intact.       IMAGING     ASSESSMENT/PLAN   Acute community acquired pneumonia of left upper lobe  - present on admission  - COVID19 negative - last + 3 years ago - mild case   - supplemental O2 during my evaluation -room air -unlikely viral -serum fungitell in process  -legionella ab -strep pneumoniae ur AG -Histoplasma Ur Ag -sputum resp cultures -reviewed pertinent imaging with patient today - ESR -please encourage patient to use incentive spirometer few times each hour while hospitalized.     Left upper lobe mass   - possible infectious only    - patient never smoker with no family or personal history of neoplasia.    - Low pretest probability of malignancy    -  she does not vaccinate against [phone]   -she had covid mild case in 2020            Thank you for allowing me to participate in the care of this patient.   Patient/Family are satisfied with care plan and all questions have been answered.    Provider disclosure: Patient with at least one acute or chronic illness or injury that poses a threat to life or bodily function and is being managed actively during this encounter.  All of the below services have been performed independently by signing provider:  review of prior documentation from internal and or external health records.  Review of previous and current lab results.  Interview and comprehensive assessment during patient visit today. Review of current and previous chest radiographs/CT scans. Discussion of management and test interpretation with health care team and patient/family.   This document was prepared using Dragon voice recognition software and may include unintentional dictation errors.     Ottie Glazier, M.D.  Division of Pulmonary & Critical Care Medicine

## 2021-09-06 NOTE — Discharge Summary (Addendum)
Physician Discharge Summary   Patient: Gina Lane MRN: [id number] DOB: [birth year]  Admit date:     09/04/2021  Discharge date: 09/06/21  Discharge Physician: Lurene Shadow   PCP: Gildardo Pounds, PA   Recommendations at discharge:   Follow up with PCP in 1 week  Discharge Diagnoses: Principal Problem:   Sepsis (HCC) Active Problems:   CAP (community acquired pneumonia)   Mass of upper lobe of left lung   Anxiety   Acquired hypothyroidism   Age-related osteoporosis without current pathological fracture   Hypokalemia   Hyponatremia  Resolved Problems:   * No resolved hospital problems. [facility] Course:  Ms. Gina Lane is a 71 year old woman with medical history significant for anxiety, hypothyroidism and osteoporosis, who was seen in the ED on 07/29/2021 for anterior chest wall pain and CTA chest was negative for acute process except for thoracic vertebral compression fractures.  She presented to the hospital with 1 week history of cough, fever, back pain and generalized weakness.    She was admitted to the hospital for sepsis secondary to community-acquired pneumonia.  She was treated with empiric IV Rocephin and azithromycin.  Urine antigen for Streptococcus pneumoniae was positive consistent with pneumococcal pneumonia.  Left upper lobe mass is likely due to pneumonia.  However, follow-up with pulmonologist or PCP is recommended for repeat imaging to ensure complete resolution.  She had hypokalemia which was repleted as well.  It appears she has chronic hyponatremia based on chart review.  She was evaluated by PT and OT and home PT and OT were recommended.  Her condition has improved significantly.  She was discharged on oral Levaquin because of penicillin allergy.        Consultants: Pulmonologist Procedures performed: None Disposition: Home health Diet recommendation:  Discharge Diet Orders (From admission, onward)     Start     Ordered   09/06/21 0000  Diet  - low sodium heart healthy        09/06/21 1156           Cardiac diet DISCHARGE MEDICATION: Allergies as of 09/06/2021       Reactions   Penicillins Other (See Comments)   unknown unknown        Medication List     TAKE these medications    guaiFENesin-dextromethorphan 100-10 MG/5ML syrup Commonly known as: ROBITUSSIN DM Take 5 mLs by mouth every 6 (six) hours as needed for cough.   levofloxacin 750 MG tablet Commonly known as: Levaquin Take 1 tablet (750 mg total) by mouth daily for 3 days. Start taking on: September 07, 2021   LORazepam 0.5 MG tablet Commonly known as: ATIVAN Take 0.5 mg by mouth every 8 (eight) hours as needed.   mirtazapine 30 MG tablet Commonly known as: REMERON Take 30 mg by mouth at bedtime.   PARoxetine 10 MG tablet Commonly known as: PAXIL SMARTSIG:1 Tablet(s) By Mouth Every Evening   triamcinolone cream 0.1 % Commonly known as: KENALOG Apply topically 2 (two) times daily as needed.   vitamin B-12 500 MCG tablet Commonly known as: CYANOCOBALAMIN Take 500 mcg by mouth daily.   vitamin C 1000 MG tablet Take 1,000 mg by mouth daily.        Follow-up Information     Vida Rigger, MD. Schedule an appointment as soon as possible for a visit in 1 month(s).   Specialty: Pulmonary Disease Contact information: [address] [place] [address] [phone]  Discharge Exam: Filed Weights   09/04/21 1753  Weight: 42.6 kg   GEN: NAD SKIN: Warm and dry EYES: EOMI ENT: MMM CV: RRR PULM: CTA B ABD: soft, ND, NT, +BS CNS: AAO x 3, non focal EXT: No edema or tenderness   Condition at discharge: good  The results of significant diagnostics from this hospitalization (including imaging, microbiology, ancillary and laboratory) are listed below for reference.   Imaging Studies: CT Angio Chest PE W and/or Wo Contrast  Result Date: 09/04/2021 CLINICAL DATA:  Fever, cough and chest pain. EXAM: CT  ANGIOGRAPHY CHEST WITH CONTRAST TECHNIQUE: Multidetector CT imaging of the chest was performed using the standard protocol during bolus administration of intravenous contrast. Multiplanar CT image reconstructions and MIPs were obtained to evaluate the vascular anatomy. RADIATION DOSE REDUCTION: This exam was performed according to the departmental dose-optimization program which includes automated exposure control, adjustment of the mA and/or kV according to patient size and/or use of iterative reconstruction technique. CONTRAST:  29mL OMNIPAQUE IOHEXOL 350 MG/ML SOLN COMPARISON:  Jul 29, 2021 FINDINGS: Cardiovascular: There is mild calcification of the aortic arch without evidence of aortic aneurysmal dissection. Satisfactory opacification of the pulmonary arteries to the segmental level. No evidence of pulmonary embolism. Normal heart size with mild coronary artery calcification. No pericardial effusion. Mediastinum/Nodes: A 1.5 cm precarinal lymph node is seen. 1.1 cm and 1.3 cm subcarinal lymph nodes are also noted. Thyroid gland, trachea, and esophagus demonstrate no significant findings. Lungs/Pleura: A 3.1 cm x 3.8 cm x 3.8 cm heterogeneous mass is seen within the anteromedial aspect of the left upper lobe. This is located just above the aortic arch and represents a new finding when compared to the prior study. A mild amount of surrounding atelectasis is noted. Mild to moderate severity atelectasis is also seen within the posterior aspects of the bilateral lower lobes. Mild bronchiectasis is also seen within these regions with mild mucous plugging. There are small bilateral pleural effusions. No pneumothorax is identified. Upper Abdomen: No acute abnormality. Musculoskeletal: Exaggerated kyphosis of the thoracic spine is seen with chronic compression fracture deformities noted at the levels of T9, T11 and L1. Review of the MIP images confirms the above findings. IMPRESSION: 1. No evidence of pulmonary  embolism. 2. 3.1 cm x 3.8 cm x 3.8 cm left upper lobe mass. Despite its rapid development since the prior study, there is concerning for malignancy. Further evaluation with nuclear medicine PET/CT or tissue sampling is recommended. Reference: Radiology. 2017; 284(1):228-43 3. Small bilateral pleural effusions with mild to moderate severity posterior bilateral lower lobe atelectasis. 4. Enlarged precarinal and subcarinal lymph nodes which may be, in part, reactive. Aortic Atherosclerosis (ICD10-I70.0). Electronically Signed   By: Aram Candela M.D.   On: 09/04/2021 20:31   DG Chest 2 View  Result Date: 09/04/2021 CLINICAL DATA:  Cough and fever EXAM: CHEST - 2 VIEW COMPARISON:  07/29/2021 FINDINGS: Small bilateral pleural effusions. No focal consolidation. Stable cardiomediastinal silhouette. No pneumothorax. Multiple compression deformities of the mid to lower thoracic spine IMPRESSION: Interval small bilateral pleural effusion Electronically Signed   By: Jasmine Pang M.D.   On: 09/04/2021 18:42    Microbiology: Results for orders placed or performed during the hospital encounter of 09/04/21  Resp Panel by RT-PCR (Flu A&B, Covid) Anterior Nasal Swab     Status: None   Collection Time: 09/04/21  6:03 PM   Specimen: Anterior Nasal Swab  Result Value Ref Range Status   SARS Coronavirus 2 by RT PCR  NEGATIVE NEGATIVE Final    Comment: (NOTE) SARS-CoV-2 target nucleic acids are NOT DETECTED.  The SARS-CoV-2 RNA is generally detectable in upper respiratory specimens during the acute phase of infection. The lowest concentration of SARS-CoV-2 viral copies this assay can detect is 138 copies/mL. A negative result does not preclude SARS-Cov-2 infection and should not be used as the sole basis for treatment or other patient management decisions. A negative result may occur with  improper specimen collection/handling, submission of specimen other than nasopharyngeal swab, presence of viral mutation(s)  within the areas targeted by this assay, and inadequate number of viral copies(<138 copies/mL). A negative result must be combined with clinical observations, patient history, and epidemiological information. The expected result is Negative.  Fact Sheet for Patients:  EntrepreneurPulse.com.au  Fact Sheet for Healthcare Providers:  IncredibleEmployment.be  This test is no t yet approved or cleared by the [place] FDA and  has been authorized for detection and/or diagnosis of SARS-CoV-2 by FDA under an Emergency Use Authorization (EUA). This EUA will remain  in effect (meaning this test can be used) for the duration of the COVID-19 declaration under Section 564(b)(1) of the Act, 21 U.S.C.section 360bbb-3(b)(1), unless the authorization is terminated  or revoked sooner.       Influenza A by PCR NEGATIVE NEGATIVE Final   Influenza B by PCR NEGATIVE NEGATIVE Final    Comment: (NOTE) The Xpert Xpress SARS-CoV-2/FLU/RSV plus assay is intended as an aid in the diagnosis of influenza from Nasopharyngeal swab specimens and should not be used as a sole basis for treatment. Nasal washings and aspirates are unacceptable for Xpert Xpress SARS-CoV-2/FLU/RSV testing.  Fact Sheet for Patients: EntrepreneurPulse.com.au  Fact Sheet for Healthcare Providers: IncredibleEmployment.be  This test is not yet approved or cleared by the [place] FDA and has been authorized for detection and/or diagnosis of SARS-CoV-2 by FDA under an Emergency Use Authorization (EUA). This EUA will remain in effect (meaning this test can be used) for the duration of the COVID-19 declaration under Section 564(b)(1) of the Act, 21 U.S.C. section 360bbb-3(b)(1), unless the authorization is terminated or revoked.  Performed at [facility], [address] [place], [place] [address]   Culture, blood (routine x 2)     Status:  None (Preliminary result)   Collection Time: 09/04/21  6:03 PM   Specimen: BLOOD  Result Value Ref Range Status   Specimen Description BLOOD RIGHT ANTECUBITAL  Final   Special Requests   Final    BOTTLES DRAWN AEROBIC AND ANAEROBIC Blood Culture adequate volume   Culture   Final    NO GROWTH 2 DAYS Performed at [facility], [address], [place], [place] [address]    Report Status PENDING  Incomplete  Culture, blood (routine x 2)     Status: None (Preliminary result)   Collection Time: 09/04/21  6:54 PM   Specimen: BLOOD  Result Value Ref Range Status   Specimen Description BLOOD RIGHT ANTECUBITAL  Final   Special Requests   Final    BOTTLES DRAWN AEROBIC AND ANAEROBIC Blood Culture adequate volume   Culture   Final    NO GROWTH 2 DAYS Performed at [facility], [address], [place], [place] [address]    Report Status PENDING  Incomplete  Urine Culture     Status: None   Collection Time: 09/04/21  6:54 PM   Specimen: In/Out Cath Urine  Result Value Ref Range Status   Specimen Description   Final  IN/OUT CATH URINE Performed at [facility], [address] [place], [place] [address]    Special Requests   Final    NONE Performed at [facility], [address] [place], [place] [address]    Culture   Final    NO GROWTH Performed at [facility], [place] [address] [place], [place] [address]    Report Status 09/06/2021 FINAL  Final  Surgical pcr screen     Status: Abnormal   Collection Time: 09/05/21 12:16 PM   Specimen: Nasal Mucosa; Nasal Swab  Result Value Ref Range Status   MRSA, PCR POSITIVE (A) NEGATIVE Final    Comment: RESULT CALLED TO, READ BACK BY AND VERIFIED WITH: DAJEA SOOT 09/05/21 1404 KLW    Staphylococcus aureus POSITIVE (A) NEGATIVE Final    Comment: (NOTE) The Xpert SA Assay (FDA approved for NASAL specimens in patients 70 years of age and older), is one component of a comprehensive surveillance  program. It is not intended to diagnose infection nor to guide or monitor treatment. Performed at [facility], [address] [place], [place] [address]     Labs: CBC: Recent Labs  Lab 09/04/21 1803 09/05/21 0508 09/06/21 0952  WBC 15.2* 13.3* 12.1*  NEUTROABS 13.2*  --  9.4*  HGB 13.3 11.2* 11.3*  HCT 40.1 34.2* 34.3*  MCV 89.1 90.2 89.8  PLT 304 287 [id number]   Basic Metabolic Panel: Recent Labs  Lab 09/04/21 1803 09/05/21 0508 09/06/21 0952  NA 129* 134* 131*  K 3.7 3.3* 3.2*  CL 92* 104 101  CO2 23 25 19*  GLUCOSE 122* 105* 194*  BUN 9 <5* 5*  CREATININE 0.64 0.55 0.68  CALCIUM 9.0 8.1* 7.9*   Liver Function Tests: Recent Labs  Lab 09/04/21 1803  AST 26  ALT 21  ALKPHOS 101  BILITOT 0.8  PROT 7.9  ALBUMIN 3.8   CBG: No results for input(s): "GLUCAP" in the last 168 hours.  Discharge time spent: greater than 30 minutes.  Signed: Jennye Boroughs, MD [facility] 09/06/2021

## 2021-09-06 NOTE — Evaluation (Signed)
Physical Therapy Evaluation Patient Details Name: Gina Lane MRN: [id number] DOB: Feb 17, 1951 Today's Date: 09/06/2021  History of Present Illness  Patient is a 71 year old female with 1 week history of cough, fever, back pain and weakness. Found to have sepsis with community aquired pneumonia. Chest pain on coughing with left upper lobe mass found with CTA chest.  History of falls and compression wedge fractures, anxiety, osteoporosis.  Clinical Impression  Patient is agreeable to PT evaluation. She lives with her spouse and ambulates with a cane or four wheeled walker. She has assistance from spouse with lower body ADL tasks at baseline due to limited range of motion with chronic back pain.  Today, the patient is moving well, requiring no physical assistance with mobility. She was able to get out of bed, stand, and ambulated a lap in the hallway with rolling walker with supervision. Encouraged patient to use walker for safety. Mild coughing noted after walking. Recommend home health PT at discharge, and patient is agreeable as she reports home health was recently anticipated to start. PT will continue to follow to maximize independence and decrease caregiver burden.      Recommendations for follow up therapy are one component of a multi-disciplinary discharge planning process, led by the attending physician.  Recommendations may be updated based on patient status, additional functional criteria and insurance authorization.  Follow Up Recommendations Home health PT      Assistance Recommended at Discharge PRN  Patient can return home with the following  Help with stairs or ramp for entrance;Assist for transportation;A little help with bathing/dressing/bathroom    Equipment Recommendations None recommended by PT  Recommendations for Other Services       Functional Status Assessment Patient has had a recent decline in their functional status and demonstrates the ability to make  significant improvements in function in a reasonable and predictable amount of time.     Precautions / Restrictions Precautions Precautions: Fall Precaution Comments: history of wedge compression fractures and pain with trunk flexion Restrictions Weight Bearing Restrictions: No      Mobility  Bed Mobility Overal bed mobility: Needs Assistance Bed Mobility: Supine to Sit, Sit to Supine     Supine to sit: Supervision, HOB elevated Sit to supine: Supervision, HOB elevated   General bed mobility comments: increased time required with no physical assistance needed    Transfers Overall transfer level: Needs assistance Equipment used: Rolling walker (2 wheels) Transfers: Sit to/from Stand Sit to Stand: Supervision           General transfer comment: cues for hand placement for safety. supervision with standing from multiple surfaces including bed and from toilet. no physical assistance required    Ambulation/Gait Ambulation/Gait assistance: Supervision Gait Distance (Feet): 175 Feet Assistive device: Rolling walker (2 wheels) Gait Pattern/deviations: Step-through pattern, Decreased stride length Gait velocity: decreased     General Gait Details: occasional cues to stand closer to rolling walker for support in standing. mild coughing after walking. encouraged routine short distance ambulation at home using rolling walker for safety to maintain strength  Stairs            Wheelchair Mobility    Modified Rankin (Stroke Patients Only)       Balance Overall balance assessment: Needs assistance Sitting-balance support: Feet supported Sitting balance-Leahy Scale: Good     Standing balance support: Bilateral upper extremity supported, Reliant on assistive device for balance, During functional activity Standing balance-Leahy Scale: Poor Standing balance comment: external support of  rolling walker                             Pertinent Vitals/Pain  Pain Assessment Pain Assessment: No/denies pain    Home Living Family/patient expects to be discharged to:: Private residence Living Arrangements: Spouse/significant other Available Help at Discharge: Family Type of Home: House Home Access: Stairs to enter   Secretary/administrator of Steps: 3   Home Layout: One level Home Equipment: Rollator (4 wheels);Cane - single point      Prior Function Prior Level of Function : Independent/Modified Independent             Mobility Comments: using cane inside, rollator for the community. intermittent HHA from spouse with walking and stairs ADLs Comments: needs assistance for lower body dressing and bathing due to mobility restrictions from back pain     Hand Dominance        Extremity/Trunk Assessment   Upper Extremity Assessment Upper Extremity Assessment: Defer to OT evaluation    Lower Extremity Assessment Lower Extremity Assessment: Generalized weakness       Communication   Communication: No difficulties  Cognition Arousal/Alertness: Awake/alert Behavior During Therapy: WFL for tasks assessed/performed Overall Cognitive Status: Within Functional Limits for tasks assessed                                          General Comments      Exercises     Assessment/Plan    PT Assessment Patient needs continued PT services  PT Problem List Decreased strength;Decreased activity tolerance;Decreased balance;Decreased mobility       PT Treatment Interventions DME instruction;Gait training;Stair training;Functional mobility training;Therapeutic activities;Therapeutic exercise;Balance training;Neuromuscular re-education;Patient/family education    PT Goals (Current goals can be found in the Care Plan section)  Acute Rehab PT Goals Patient Stated Goal: to return home PT Goal Formulation: With patient Time For Goal Achievement: 09/20/21 Potential to Achieve Goals: Good    Frequency Min 2X/week      Co-evaluation               AM-PAC PT "6 Clicks" Mobility  Outcome Measure Help needed turning from your back to your side while in a flat bed without using bedrails?: None Help needed moving from lying on your back to sitting on the side of a flat bed without using bedrails?: A Little Help needed moving to and from a bed to a chair (including a wheelchair)?: A Little Help needed standing up from a chair using your arms (e.g., wheelchair or bedside chair)?: A Little Help needed to walk in hospital room?: A Little Help needed climbing 3-5 steps with a railing? : A Little 6 Click Score: 19    End of Session Equipment Utilized During Treatment: Gait belt Activity Tolerance: Patient tolerated treatment well Patient left: in bed;with call bell/phone within reach;with bed alarm set   PT Visit Diagnosis: Muscle weakness (generalized) (M62.81);Unsteadiness on feet (R26.81)    Time: 4270-6237 PT Time Calculation (min) (ACUTE ONLY): 25 min   Charges:   PT Evaluation $PT Eval Low Complexity: 1 Low PT Treatments $Gait Training: 8-22 mins        Donna Bernard, PT, MPT  [employer] [employer] 09/06/2021, 12:16 PM

## 2021-09-06 NOTE — TOC Progression Note (Signed)
Transition of Care [facility]) - Progression Note    Patient Details  Name: Gina Lane MRN: [id number] Date of Birth: 10/07/1950  Transition of Care [facility]) CM/SW Contact  Maree Krabbe, LCSW Phone Number: 09/06/2021, 2:27 PM  Clinical Narrative:   Advanced will service for [facility]. No dme needs.          Expected Discharge Plan and Services           Expected Discharge Date: 09/06/21                                     Social Determinants of Health (SDOH) Interventions    Readmission Risk Interventions     No data to display

## 2021-09-07 LAB — LEGIONELLA PNEUMOPHILA TOTAL AB: Legionella Pneumo Total Ab: <0.91 OD ratio (ref 0.00–0.90)

## 2021-09-08 LAB — FUNGITELL, SERUM: Fungitell Result: <31 pg/mL (ref <80)

## 2021-09-09 LAB — CULTURE, BLOOD (ROUTINE X 2)
Culture: NO GROWTH
Culture: NO GROWTH
Special Requests: ADEQUATE
Special Requests: ADEQUATE

## 2021-09-21 LAB — HISTOPLASMA GAL'MANNAN AG SER: Histoplasma Gal'mannan Ag Ser: <0.5 (ref <0.5)

## 2021-10-03 ENCOUNTER — Other Ambulatory Visit: Payer: Self-pay | Admitting: Orthopedic Surgery

## 2021-10-03 ENCOUNTER — Ambulatory Visit
Admission: RE | Admit: 2021-10-03 | Discharge: 2021-10-03 | Disposition: A | Discharge status: Home / Self Care | Payer: Medicare Other | Source: Ambulatory Visit | Attending: Orthopedic Surgery | Admitting: Orthopedic Surgery

## 2021-10-03 DIAGNOSIS — S22060A Wedge compression fracture of T7-T8 vertebra, initial encounter for closed fracture: Secondary | ICD-10-CM | POA: Diagnosis not present

## 2021-10-16 ENCOUNTER — Other Ambulatory Visit: Payer: Self-pay | Admitting: Orthopedic Surgery

## 2021-10-16 DIAGNOSIS — S22060A Wedge compression fracture of T7-T8 vertebra, initial encounter for closed fracture: Secondary | ICD-10-CM

## 2021-10-19 ENCOUNTER — Other Ambulatory Visit: Payer: Self-pay | Admitting: Physician Assistant

## 2021-10-19 ENCOUNTER — Other Ambulatory Visit: Payer: Self-pay | Admitting: Interventional Radiology

## 2021-10-19 ENCOUNTER — Other Ambulatory Visit (HOSPITAL_COMMUNITY): Payer: Self-pay | Admitting: Interventional Radiology

## 2021-10-19 ENCOUNTER — Ambulatory Visit
Admission: RE | Admit: 2021-10-19 | Discharge: 2021-10-19 | Disposition: A | Discharge status: Home / Self Care | Payer: Medicare Other | Source: Ambulatory Visit | Attending: Orthopedic Surgery | Admitting: Orthopedic Surgery

## 2021-10-19 ENCOUNTER — Other Ambulatory Visit: Payer: Self-pay | Admitting: Pulmonary Disease

## 2021-10-19 DIAGNOSIS — Z712 Person consulting for explanation of examination or test findings: Secondary | ICD-10-CM

## 2021-10-19 DIAGNOSIS — J189 Pneumonia, unspecified organism: Secondary | ICD-10-CM

## 2021-10-19 DIAGNOSIS — S22060A Wedge compression fracture of T7-T8 vertebra, initial encounter for closed fracture: Secondary | ICD-10-CM

## 2021-10-19 MED ORDER — METHOCARBAMOL 750 MG PO TABS: 750.0000 mg | ORAL_TABLET | Freq: Four times a day (QID) | ORAL | 0 refills | Status: DC

## 2021-10-19 NOTE — Consult Note (Signed)
Chief Complaint: Patient was seen in consultation today for back pain at the request of Mundy,Todd  Referring Physician(s): Mundy,Todd  History of Present Illness: Gina Lane is a 71 y.o. female who presents at the kind request of Dr. Andrew Au to be evaluated for an acute/subacute and unhealed T7 compression fracture.  She has a long history of back pain going back at least 3 years when she suffered a fall while in [place] taking care of of her mother.  Over these past 3 years, she has been diagnosed with multiple compression fractures but has not had prior intervention.  Typically, she is able to perform her activities of daily living for a period of time in the morning before she needs to stop and rest by laying down in the bed and allowing her pain to resolve.  Then, she can get up again in the afternoon and work on preparing her afternoon/evening meal before the pain becomes too great and she has to lay down in the bed and rest.  At baseline, her pain is a chronic 2 out of 10 on a 10 point scale.  However, when she is sitting upright or moving around, her pain escalates to a 10 out of 10.  This is quite debilitating for her, she scores a 20 out of 24 on the [employer] disability questionnaire.  Currently, she is using Tylenol and occasionally tramadol 50 mg p.o. for pain.  This seems to help somewhat but does not prevent her from having to lie down for the pain to resolve.  She describes the pain as a tightness that occurs in the right paraspinal muscles at the level of her waistline.  On physical exam, this appears to correlate more with the T12-L1 level.  MR imaging from 10/03/2021 demonstrates chronic fractures at T8, T10 and T12 and a new unhealed acute fracture at T7 with 50% height loss. The T7 fracture was new compared to relatively recent imaging from July 2023.  She has noted that over the last few weeks to a month that her symptoms have increased.  Additionally, she has  generalized weakness which she has had for some time.  She currently has a physical therapist coming to the house to help her.  She denies nausea, vomiting, or unintentional weight loss.  Of note, her imaging has also shown a left upper lobe mass.  Past Medical History:  Diagnosis Date   Anxiety    Osteoporosis     No past surgical history on file.  Allergies: Penicillins  Medications: Prior to Admission medications   Medication Sig Start Date End Date Taking? Authorizing Provider  acetaminophen (TYLENOL) 500 MG tablet Take by mouth. 11/28/18  Yes [provider]  Ascorbic Acid (VITAMIN C) 1000 MG tablet Take 1,000 mg by mouth daily.   Yes [provider]  Iodine Strong, Lugols, (IODINE STRONG PO) Take by mouth.   Yes [provider]  LORazepam (ATIVAN) 0.5 MG tablet Take 0.5 mg by mouth every 8 (eight) hours as needed. 09/09/20  Yes [provider]  mirtazapine (REMERON) 30 MG tablet Take 30 mg by mouth at bedtime. 08/08/21  Yes [provider]  sulfamethoxazole-trimethoprim (BACTRIM) 400-80 MG tablet Take by mouth. 10/16/21 10/23/21 Yes [provider]  traMADol (ULTRAM) 50 MG tablet Take 50 mg by mouth 4 (four) times daily as needed. 10/03/21  Yes [provider]  vitamin B-12 (CYANOCOBALAMIN) 500 MCG tablet Take 500 mcg by mouth daily.   Yes [provider]  guaiFENesin-dextromethorphan (ROBITUSSIN DM) 100-10 MG/5ML syrup Take 5 mLs by mouth every 6 (six) hours as needed for cough. Patient not taking: Reported on 10/19/2021 09/06/21   Lurene Shadow, MD  triamcinolone cream (KENALOG) 0.1 % Apply topically 2 (two) times daily as needed. Patient not taking: Reported on 10/19/2021 08/08/21   [provider]     No family history on file.  Social History   Socioeconomic History   Marital status: Married    Spouse name: Not on file   Number of children: Not on file   Years of education: Not on file   Highest  education level: Not on file  Occupational History   Not on file  Tobacco Use   Smoking status: Never   Smokeless tobacco: Never  Vaping Use   Vaping Use: Never used  Substance and Sexual Activity   Alcohol use: Yes    Alcohol/week: 7.0 standard drinks of alcohol    Types: 7 Glasses of wine per week   Drug use: Never   Sexual activity: Not on file  Other Topics Concern   Not on file  Social History Narrative   Not on file   Social Determinants of Health   Financial Resource Strain: Not on file  Food Insecurity: Not on file  Transportation Needs: Not on file  Physical Activity: Not on file  Stress: Not on file  Social Connections: Not on file   Review of Systems: A 12 point ROS discussed and pertinent positives are indicated in the HPI above.  All other systems are negative.  Review of Systems  Vital Signs: BP 119/71 (BP Location: Left Arm, Patient Position: Sitting, Cuff Size: Small)   Pulse 84   Temp 98.8 F (37.1 C) (Oral)   Resp 14   Ht 4\' 9"  (1.448 m)   Wt 38.1 kg   SpO2 99%   BMI 18.18 kg/m    Physical Exam Constitutional:      General: She is not in acute distress.    Appearance: Normal appearance.  HENT:     Head: Normocephalic and atraumatic.  Eyes:     General: No scleral icterus. Cardiovascular:     Rate and Rhythm: Normal rate.  Pulmonary:     Effort: Pulmonary effort is normal.  Abdominal:     General: Abdomen is flat.  Musculoskeletal:       Back:     Comments: Mild TTP at T7 spinous process as well as lower at T12/L1 and in the right paraspinal muscles at this same level.   Skin:    General: Skin is warm and dry.  Neurological:     Mental Status: She is alert and oriented to person, place, and time.  Psychiatric:        Mood and Affect: Mood normal.        Behavior: Behavior normal.       Imaging: DG Radiologist Eval And Mgmt  Result Date: 10/19/2021 EXAM: NEW PATIENT OFFICE VISIT CHIEF COMPLAINT: SEE EPIC NOTE HISTORY OF  PRESENT ILLNESS: SEE EPIC NOTE REVIEW OF SYSTEMS: SEE EPIC NOTE PHYSICAL EXAMINATION: SEE EPIC NOTE ASSESSMENT AND PLAN: SEE EPIC NOTE Electronically Signed   By: 10/21/2021 M.D.   On: 10/19/2021 13:10   MR THORACIC SPINE WO CONTRAST  Result Date: 10/03/2021 CLINICAL DATA:  Back pain with history of trauma and multiple fractures. EXAM: MRI THORACIC SPINE WITHOUT CONTRAST TECHNIQUE: Multiplanar, multisequence MR imaging of the thoracic spine was performed. No intravenous contrast  was administered. COMPARISON:  09/04/2021 FINDINGS: Alignment: There is exaggerated kyphosis about the midthoracic spine. No static subluxation. There is right convex scoliosis with apex at T9. Vertebrae: Since 09/04/2021, there has been a fracture of T7 with approximately 50% height loss. No retropulsion. Fractures at T8, T10 and T12 are unchanged. Cord:  Normal signal and morphology. Paraspinal and other soft tissues: There is a 2.1 cm nodule at the left lung apex. Disc levels: Spinal canal is widely patent at all levels. IMPRESSION: 1. T7 fracture with 50% height loss and no retropulsion, new since 09/04/2021. 2. Unchanged T8, T10 and T12 compression fractures. 3. No spinal canal stenosis. 4. A 2.1 cm nodule at the left lung apex. This was recently better visualized CTA of the chest 09/04/2021. Electronically Signed   By: Ulyses Jarred M.D.   On: 10/03/2021 20:06    Labs:  CBC: Recent Labs    07/29/21 1209 09/04/21 1803 09/05/21 0508 09/06/21 0952  WBC 7.2 15.2* 13.3* 12.1*  HGB 15.0 13.3 11.2* 11.3*  HCT 46.3* 40.1 34.2* 34.3*  PLT 237 304 287 347    COAGS: Recent Labs    09/04/21 1827  INR 1.1  APTT 41*    BMP: Recent Labs    07/29/21 1209 09/04/21 1803 09/05/21 0508 09/06/21 0952  NA 135 129* 134* 131*  K 3.9 3.7 3.3* 3.2*  CL 100 92* 104 101  CO2 21* 23 25 19*  GLUCOSE 97 122* 105* 194*  BUN 16 9 <5* 5*  CALCIUM 9.6 9.0 8.1* 7.9*  CREATININE 0.73 0.64 0.55 0.68  GFRNONAA >60 >60 >60  >60    LIVER FUNCTION TESTS: Recent Labs    09/04/21 1803  BILITOT 0.8  AST 26  ALT 21  ALKPHOS 101  PROT 7.9  ALBUMIN 3.8    TUMOR MARKERS: No results for input(s): "AFPTM", "CEA", "CA199", "CHROMGRNA" in the last 8760 hours.  Assessment and Plan:  Very pleasant 71 year old female with worsening chronic back pain.  While she does have an acute fracture at T7 and some pain in the region on tenderness to palpation, her primary complaint involves right paraspinal pain lower down in the region of L1.  It is difficult to determine if this represents referred pain from the T7 fracture versus other musculoskeletal pain, or referred pain from underlying facet arthropathy or degenerative disc disease.   I reviewed in detail with her the pathology of her T7 compression fracture, as well as the possibilities of facet arthropathy, degenerative disc disease and musculoskeletal pain.  We talked about the different treatment options for these disease processes.  We discussed proceeding with cement augmentation at T7 versus adopting a more conservative approach.  She would like to proceed conservatively for now.  I think this is a fine choice.  1.)  Methocarbamol 1000 mg p.o. every 8 hours as needed for pain and spasm.  I will have this called into her pharmacy at the [facility] in [place]. 2.)  Follow-up visit in 2 weeks to assess progress.  Thank you for this interesting consult.  I greatly enjoyed meeting [facility] and look forward to participating in their care.  A copy of this report was sent to the requesting provider on this date.  Electronically Signed: Criselda Peaches 10/19/2021, 1:38 PM   I spent a total of  60 Minutes  in face to face in clinical consultation, greater than 50% of which was counseling/coordinating care for back pain.

## 2021-11-02 ENCOUNTER — Ambulatory Visit
Admission: RE | Admit: 2021-11-02 | Discharge: 2021-11-02 | Disposition: A | Discharge status: Home / Self Care | Payer: Medicare Other | Source: Ambulatory Visit | Attending: Pulmonary Disease | Admitting: Pulmonary Disease

## 2021-11-02 DIAGNOSIS — J189 Pneumonia, unspecified organism: Secondary | ICD-10-CM

## 2021-11-02 MED ORDER — IOPAMIDOL (ISOVUE-300) INJECTION 61%
75.0000 mL | Freq: Once | INTRAVENOUS | Status: AC | PRN
Start: 2021-11-02 — End: 2021-11-02
  Administered 2021-11-02: 75 mL via INTRAVENOUS

## 2021-11-07 ENCOUNTER — Ambulatory Visit
Admission: RE | Admit: 2021-11-07 | Discharge: 2021-11-07 | Disposition: A | Discharge status: Home / Self Care | Payer: Medicare Other | Source: Ambulatory Visit | Attending: Interventional Radiology | Admitting: Interventional Radiology

## 2021-11-07 ENCOUNTER — Other Ambulatory Visit (HOSPITAL_COMMUNITY): Payer: Self-pay | Admitting: Interventional Radiology

## 2021-11-07 ENCOUNTER — Other Ambulatory Visit (HOSPITAL_COMMUNITY): Payer: Self-pay | Admitting: Radiology

## 2021-11-07 ENCOUNTER — Other Ambulatory Visit: Payer: Medicare Other

## 2021-11-07 DIAGNOSIS — Z712 Person consulting for explanation of examination or test findings: Secondary | ICD-10-CM

## 2021-11-07 HISTORY — PX: IR RADIOLOGIST EVAL & MGMT: IMG5224

## 2021-11-07 MED ORDER — METHOCARBAMOL 1000 MG PO TABS: 1000.0000 mg | ORAL_TABLET | Freq: Three times a day (TID) | ORAL | 0 refills | Status: AC | PRN

## 2021-11-07 NOTE — Progress Notes (Signed)
Chief Complaint: Patient was consulted remotely today (TeleHealth) for low back pain at the request of El-Abd,Yasser J.    Referring Physician(s): Dr. Dedra Skeens  History of Present Illness: Gina Lane is a 71 y.o. female who was first seen on 10/19/2021 to be evaluated for an acute/subacute T7 compression fracture.  After a thorough imaging review, history and physical examination it seems that her pain was more musculoskeletal in nature arising in the right paraspinal muscles at the level of L1.  Due to the lack of symptoms specific to the T7 compression fracture we elected to proceed with an initial conservative trial of a muscle relaxer (methocarbamol).  We spoke over the telephone today to assess how she has done over these past 2 weeks.  Gina Lane is pleased to report that the muscle relaxer was very successful in alleviating her symptoms.  She has had near complete relief of pain over the past 2 weeks although since that she has finished her prescription she is beginning to notice the pain returning.  She has no new pain and has had no new injuries.  Importantly, she reports no adverse effects from the muscle relaxer.  She had no significant fatigue, tiredness, changes in her balance or mental acuity.  She would like to continue to take the muscle relaxer as needed for pain relief.  She is not interested in pursuing intervention at this time.  Past Medical History:  Diagnosis Date   Anxiety    Osteoporosis     Past Surgical History:  Procedure Laterality Date   IR RADIOLOGIST EVAL & MGMT  11/07/2021    Allergies: Penicillins  Medications: Prior to Admission medications   Medication Sig Start Date End Date Taking? Authorizing Provider  acetaminophen (TYLENOL) 500 MG tablet Take by mouth. 11/28/18   [provider]  Ascorbic Acid (VITAMIN C) 1000 MG tablet Take 1,000 mg by mouth daily.    [provider]  guaiFENesin-dextromethorphan (ROBITUSSIN DM)  100-10 MG/5ML syrup Take 5 mLs by mouth every 6 (six) hours as needed for cough. Patient not taking: Reported on 10/19/2021 09/06/21   Lurene Shadow, MD  Iodine Strong, Lugols, (IODINE STRONG PO) Take by mouth.    [provider]  LORazepam (ATIVAN) 0.5 MG tablet Take 0.5 mg by mouth every 8 (eight) hours as needed. 09/09/20   [provider]  methocarbamol 1000 MG TABS Take 1,000 mg by mouth every 8 (eight) hours as needed for muscle spasms. 11/07/21   Alene Mires, NP  mirtazapine (REMERON) 30 MG tablet Take 30 mg by mouth at bedtime. 08/08/21   [provider]  traMADol (ULTRAM) 50 MG tablet Take 50 mg by mouth 4 (four) times daily as needed. 10/03/21   [provider]  triamcinolone cream (KENALOG) 0.1 % Apply topically 2 (two) times daily as needed. Patient not taking: Reported on 10/19/2021 08/08/21   [provider]  vitamin B-12 (CYANOCOBALAMIN) 500 MCG tablet Take 500 mcg by mouth daily.    [provider]     No family history on file.  Social History   Socioeconomic History   Marital status: Married    Spouse name: Not on file   Number of children: Not on file   Years of education: Not on file   Highest education level: Not on file  Occupational History   Not on file  Tobacco Use   Smoking status: Never   Smokeless tobacco: Never  Vaping Use   Vaping  Use: Never used  Substance and Sexual Activity   Alcohol use: Yes    Alcohol/week: 7.0 standard drinks of alcohol    Types: 7 Glasses of wine per week   Drug use: Never   Sexual activity: Not on file  Other Topics Concern   Not on file  Social History Narrative   Not on file   Social Determinants of Health   Financial Resource Strain: Not on file  Food Insecurity: Not on file  Transportation Needs: Not on file  Physical Activity: Not on file  Stress: Not on file  Social Connections: Not on file     Review of Systems  Review of Systems: A 12 point ROS  discussed and pertinent positives are indicated in the HPI above.  All other systems are negative.    Physical Exam No direct physical exam was performed (except for noted visual exam findings with Video Visits).    Vital Signs: There were no vitals taken for this visit.  Imaging: IR Radiologist Eval & Mgmt  Result Date: 11/07/2021 EXAM: ESTABLISHED PATIENT OFFICE VISIT CHIEF COMPLAINT: SEE EPIC NOTE HISTORY OF PRESENT ILLNESS: SEE EPIC NOTE REVIEW OF SYSTEMS: SEE EPIC NOTE PHYSICAL EXAMINATION: SEE EPIC NOTE ASSESSMENT AND PLAN: SEE EPIC NOTE Electronically Signed   By: Malachy Moan M.D.   On: 11/07/2021 14:36   CT CHEST W CONTRAST  Result Date: 11/02/2021 CLINICAL DATA:  Left upper lobe pneumonia. EXAM: CT CHEST WITH CONTRAST TECHNIQUE: Multidetector CT imaging of the chest was performed during intravenous contrast administration. RADIATION DOSE REDUCTION: This exam was performed according to the departmental dose-optimization program which includes automated exposure control, adjustment of the mA and/or kV according to patient size and/or use of iterative reconstruction technique. CONTRAST:  9mL ISOVUE-300 IOPAMIDOL (ISOVUE-300) INJECTION 61% COMPARISON:  CT chest 09/04/2021 FINDINGS: Cardiovascular: No significant vascular findings. Normal heart size. No pericardial effusion. There are atherosclerotic calcifications of the aorta and coronary arteries. Mediastinum/Nodes: No enlarged mediastinal, hilar, or axillary lymph nodes. Thyroid gland, trachea, and esophagus demonstrate no significant findings. Lungs/Pleura: Pleural effusions have resolved in the interval. There is minimal atelectasis in the right middle lobe and bilateral lower lobes. Previously identified focal airspace opacity in the medial left upper lobe has decreased in size now measuring 1.6 x 1.2 cm (previously 3.1 x 3.8 cm). No new pulmonary nodule. No new focal lung infiltrate. Upper Abdomen: No acute abnormality.  Musculoskeletal: There is accentuated kyphosis of the thoracic spine. Chronic compression deformities of T8, T10, T11 and T12 appears stable. Moderate compression deformity of T7 has slightly progressed. IMPRESSION: 1. Left upper lobe mass has decreased in size now measuring 1.6 cm. This may be related to resolving infection, but underlying neoplasm is not excluded. Recommend further evaluation with PET-CT, tissue sampling or short-term follow-up CT. 2. Pleural effusions have resolved. 3. Progression of moderate compression deformity at T7. Electronically Signed   By: Darliss Cheney M.D.   On: 11/02/2021 21:40   DG Radiologist Eval And Mgmt  Result Date: 10/19/2021 EXAM: NEW PATIENT OFFICE VISIT CHIEF COMPLAINT: SEE EPIC NOTE HISTORY OF PRESENT ILLNESS: SEE EPIC NOTE REVIEW OF SYSTEMS: SEE EPIC NOTE PHYSICAL EXAMINATION: SEE EPIC NOTE ASSESSMENT AND PLAN: SEE EPIC NOTE Electronically Signed   By: Malachy Moan M.D.   On: 10/19/2021 13:10    Labs:  CBC: Recent Labs    07/29/21 1209 09/04/21 1803 09/05/21 0508 09/06/21 0952  WBC 7.2 15.2* 13.3* 12.1*  HGB 15.0 13.3 11.2* 11.3*  HCT 46.3* 40.1  34.2* 34.3*  PLT 237 304 287 347    COAGS: Recent Labs    09/04/21 1827  INR 1.1  APTT 41*    BMP: Recent Labs    07/29/21 1209 09/04/21 1803 09/05/21 0508 09/06/21 0952  NA 135 129* 134* 131*  K 3.9 3.7 3.3* 3.2*  CL 100 92* 104 101  CO2 21* 23 25 19*  GLUCOSE 97 122* 105* 194*  BUN 16 9 <5* 5*  CALCIUM 9.6 9.0 8.1* 7.9*  CREATININE 0.73 0.64 0.55 0.68  GFRNONAA >60 >60 >60 >60    LIVER FUNCTION TESTS: Recent Labs    09/04/21 1803  BILITOT 0.8  AST 26  ALT 21  ALKPHOS 101  PROT 7.9  ALBUMIN 3.8    TUMOR MARKERS: No results for input(s): "AFPTM", "CEA", "CA199", "CHROMGRNA" in the last 8760 hours.  Assessment and Plan:  Very pleasant 71 year old female with right-sided low back pain which appears to be secondary to muscle spasms.  She reports significant  improvement following a trial of methocarbamol.  She had no ill effects from the medication and found the pain relief to be very satisfying.  1.)  Refill methocarbamol 1000 mg every 8 hours as needed for muscle pain/spasm.  We will issue her a 6-week prescription.  If her symptoms remain persistent at the end of that time, she will call back to schedule another evaluation.    Electronically Signed: Sterling Big 11/07/2021, 2:40 PM   I spent a total of  15 Minutes in remote  clinical consultation, greater than 50% of which was counseling/coordinating care for low back pain.    Visit type: Audio only (telephone). Audio (no video) only due to patient preference. Alternative for in-person consultation at [facility], [address] [place], [place], [place]. This visit type was conducted due to national recommendations for restrictions regarding the COVID-19 Pandemic (e.g. social distancing).  This format is felt to be most appropriate for this patient at this time.  All issues noted in this document were discussed and addressed.

## 2021-11-20 ENCOUNTER — Other Ambulatory Visit: Payer: Self-pay | Admitting: Pulmonary Disease

## 2021-11-20 DIAGNOSIS — R918 Other nonspecific abnormal finding of lung field: Secondary | ICD-10-CM

## 2021-11-29 ENCOUNTER — Other Ambulatory Visit (HOSPITAL_COMMUNITY): Payer: Medicare Other

## 2021-12-06 ENCOUNTER — Other Ambulatory Visit (HOSPITAL_COMMUNITY): Payer: Medicare Other

## 2021-12-06 ENCOUNTER — Ambulatory Visit: Payer: Medicare Other

## 2021-12-14 ENCOUNTER — Ambulatory Visit
Admission: RE | Admit: 2021-12-14 | Discharge: 2021-12-14 | Disposition: A | Discharge status: Home / Self Care | Payer: Medicare Other | Source: Ambulatory Visit | Attending: Pulmonary Disease | Admitting: Pulmonary Disease

## 2021-12-14 DIAGNOSIS — R918 Other nonspecific abnormal finding of lung field: Secondary | ICD-10-CM | POA: Diagnosis present

## 2021-12-14 DIAGNOSIS — I700 Atherosclerosis of aorta: Secondary | ICD-10-CM | POA: Diagnosis not present

## 2021-12-14 LAB — GLUCOSE, CAPILLARY: Glucose-Capillary: 98 mg/dL (ref 70–99)

## 2021-12-14 MED ORDER — FLUDEOXYGLUCOSE F - 18 (FDG) INJECTION
5.2000 | Freq: Once | INTRAVENOUS | Status: AC | PRN
  Administered 2021-12-14: 5.68 via INTRAVENOUS

## 2022-04-06 ENCOUNTER — Other Ambulatory Visit: Payer: Self-pay | Admitting: Family Medicine

## 2022-04-06 DIAGNOSIS — R948 Abnormal results of function studies of other organs and systems: Secondary | ICD-10-CM

## 2022-04-19 ENCOUNTER — Ambulatory Visit
Admission: RE | Admit: 2022-04-19 | Discharge: 2022-04-19 | Disposition: A | Discharge status: Home / Self Care | Payer: Medicare Other | Source: Ambulatory Visit | Attending: Family Medicine | Admitting: Family Medicine

## 2022-04-19 DIAGNOSIS — R948 Abnormal results of function studies of other organs and systems: Secondary | ICD-10-CM | POA: Diagnosis present

## 2022-05-03 ENCOUNTER — Other Ambulatory Visit: Payer: Self-pay | Admitting: Family Medicine

## 2022-05-03 DIAGNOSIS — R948 Abnormal results of function studies of other organs and systems: Secondary | ICD-10-CM

## 2022-05-03 DIAGNOSIS — R918 Other nonspecific abnormal finding of lung field: Secondary | ICD-10-CM

## 2022-06-28 ENCOUNTER — Ambulatory Visit: Payer: Self-pay | Admitting: Internal Medicine

## 2022-11-01 ENCOUNTER — Ambulatory Visit
Admission: RE | Admit: 2022-11-01 | Discharge: 2022-11-01 | Disposition: A | Discharge status: Home / Self Care | Payer: Medicare Other | Source: Ambulatory Visit | Attending: Family Medicine | Admitting: Family Medicine

## 2022-11-01 DIAGNOSIS — R918 Other nonspecific abnormal finding of lung field: Secondary | ICD-10-CM | POA: Diagnosis present

## 2022-11-01 DIAGNOSIS — R948 Abnormal results of function studies of other organs and systems: Secondary | ICD-10-CM | POA: Insufficient documentation

## 2023-01-27 IMAGING — CT CT ANGIO CHEST
2 of 7 series · 18 of 46 positions shown · IV contrast (APPLIED)
Comparison: Chest radiographs obtained earlier today. Chest CT
dated 10/24/2012.

CLINICAL DATA: Left anterior chest wall pain since this morning.

EXAM:
CT ANGIOGRAPHY CHEST WITH CONTRAST
TECHNIQUE: Multidetector CT imaging of the chest was performed using the
standard protocol during bolus administration of intravenous
contrast. Multiplanar CT image reconstructions and MIPs were
obtained to evaluate the vascular anatomy.

[Series 10: thins · axial · 0.57mm/px · z∈[-634,-405]mm · 15 of 369 slices shown]
[im 21/369  lung]
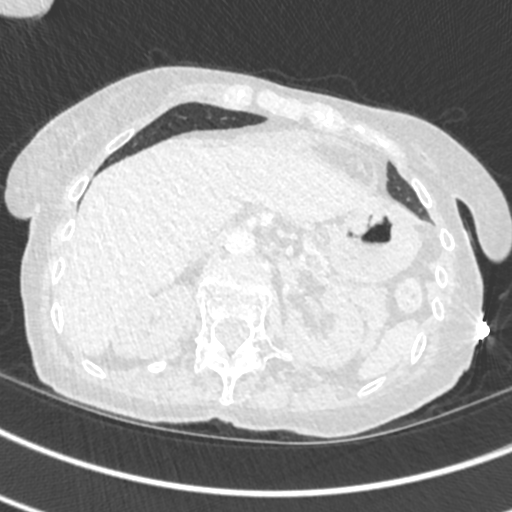
[im 41/369  soft-tissue]
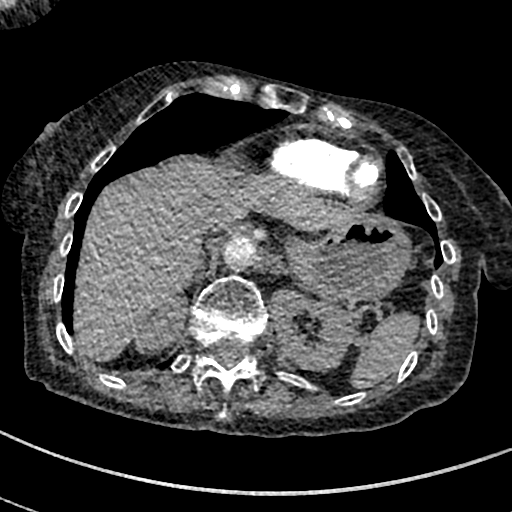
[im 62/369  lung]
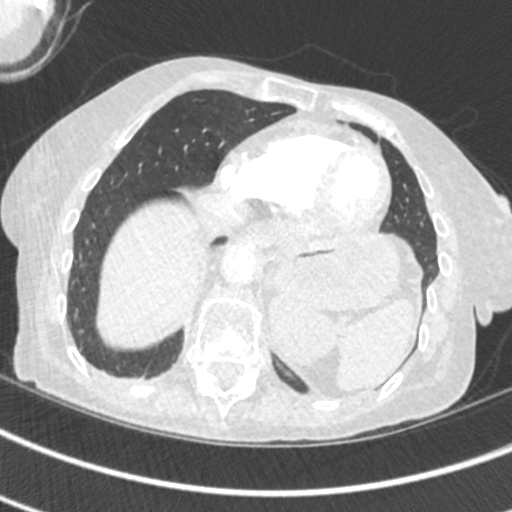
[im 82/369  soft-tissue]
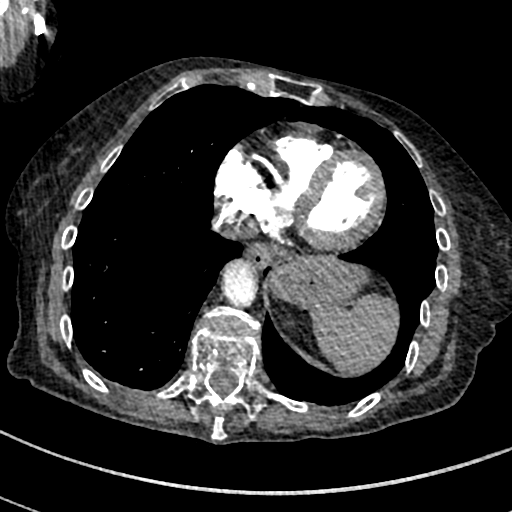
[im 123/369  lung]
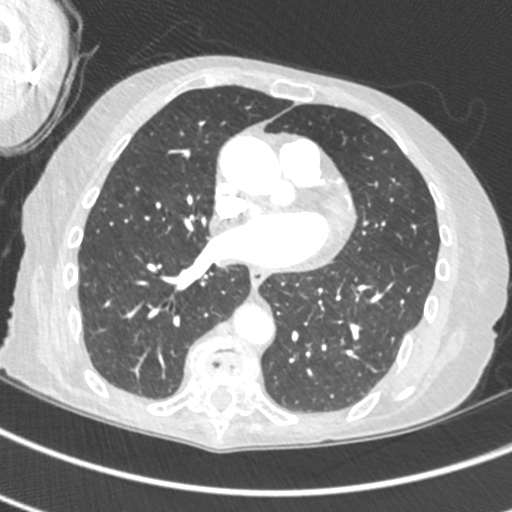
[im 144/369  soft-tissue]
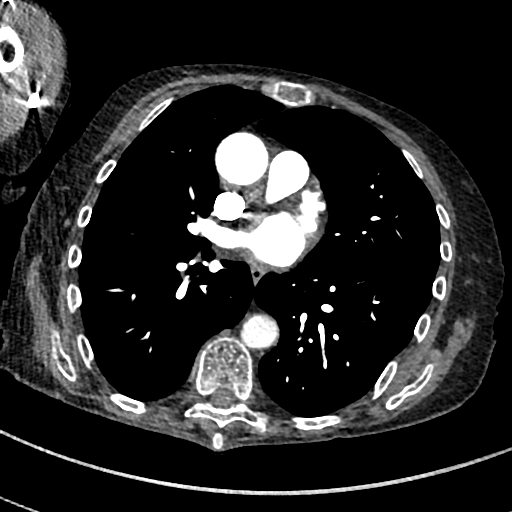
[im 164/369  lung]
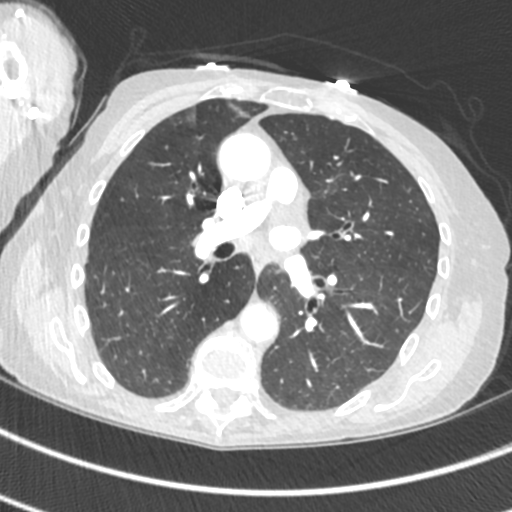
[im 185/369  soft-tissue]
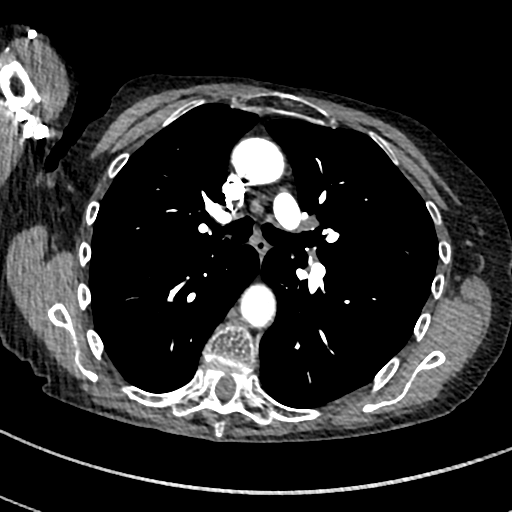
[im 205/369  lung]
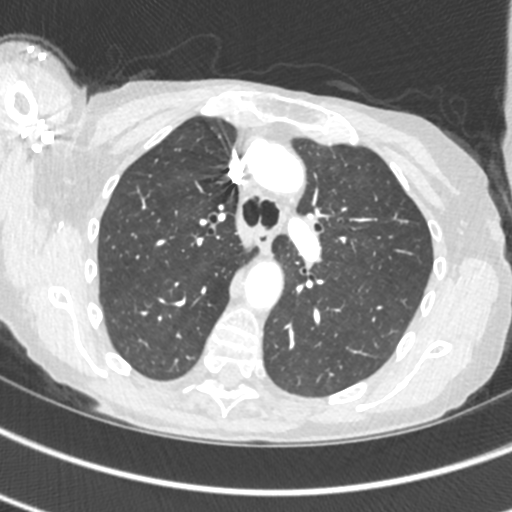
[im 225/369  soft-tissue]
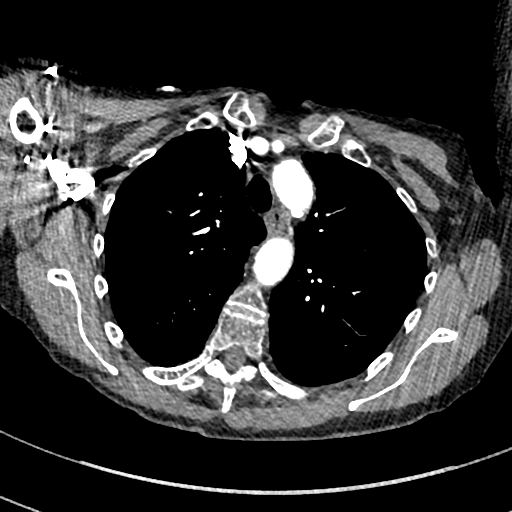
[im 246/369  lung]
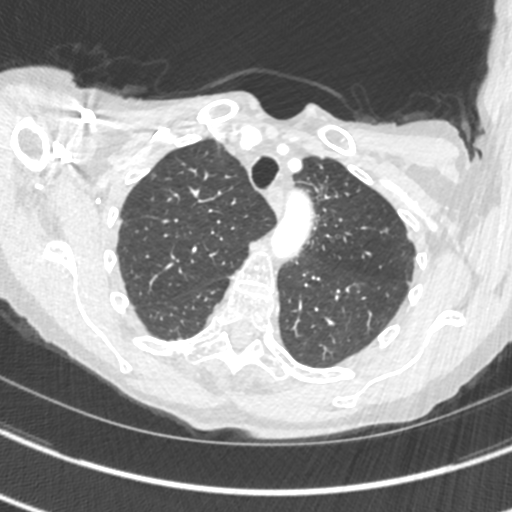
[im 287/369  soft-tissue]
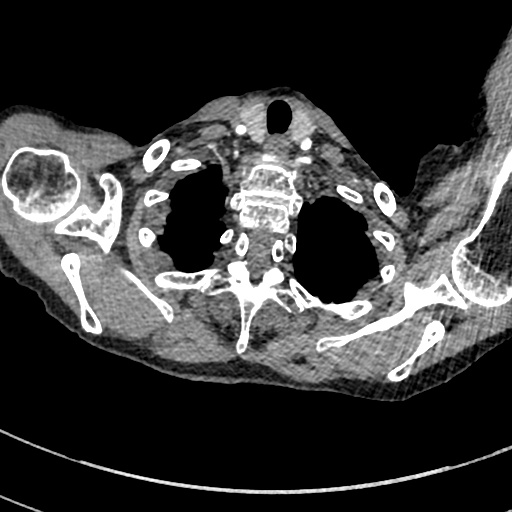
[im 307/369  lung]
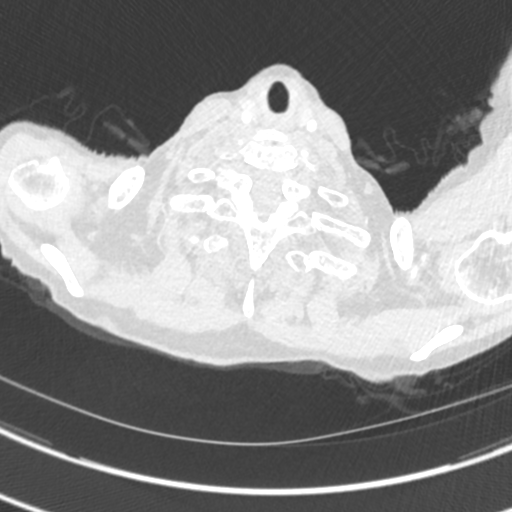
[im 328/369  soft-tissue]
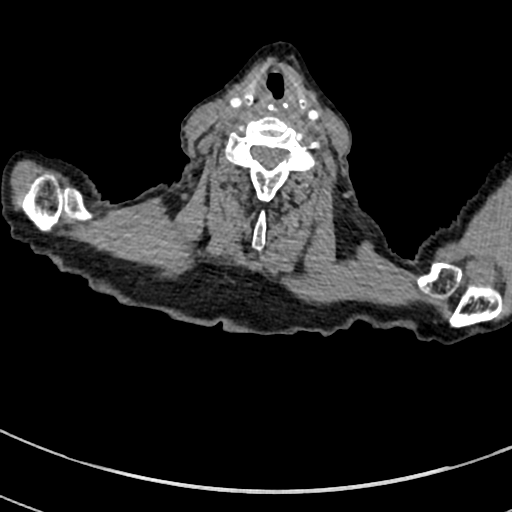
[im 348/369  lung]
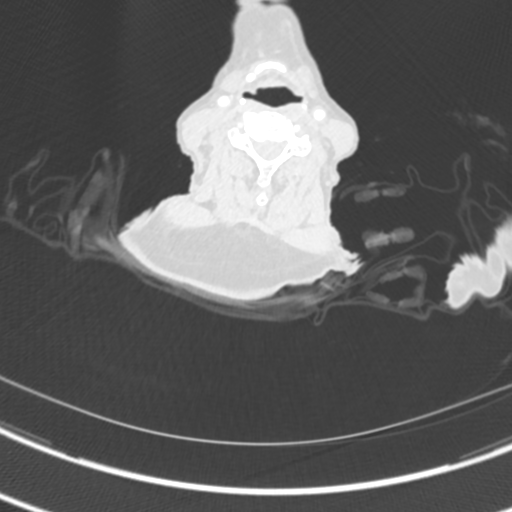

[Series 11: cor · coronal · 0.47mm/px · 3 of 177 slices shown]
[im 45/177  soft-tissue]
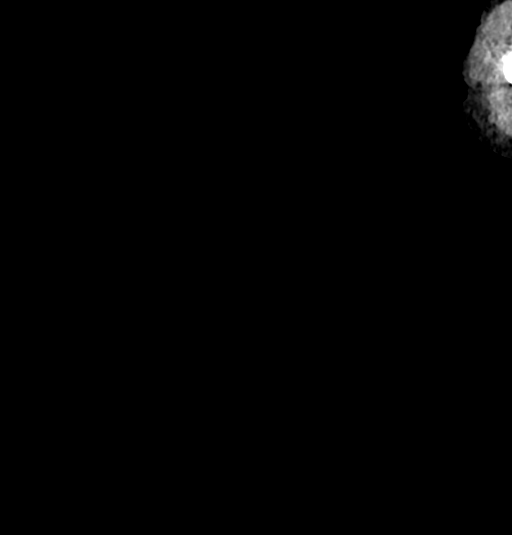
[im 89/177  soft-tissue]
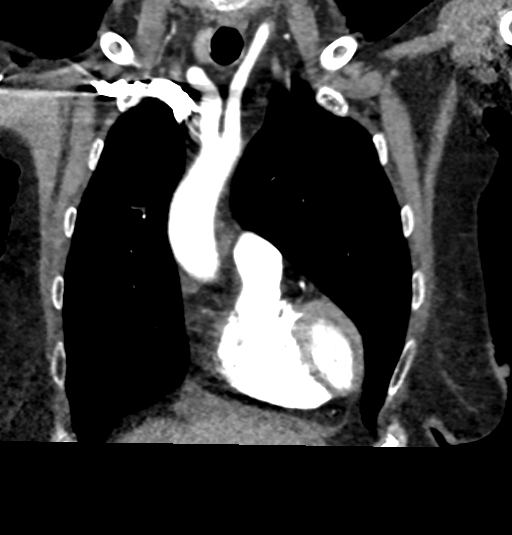
[im 133/177  soft-tissue]
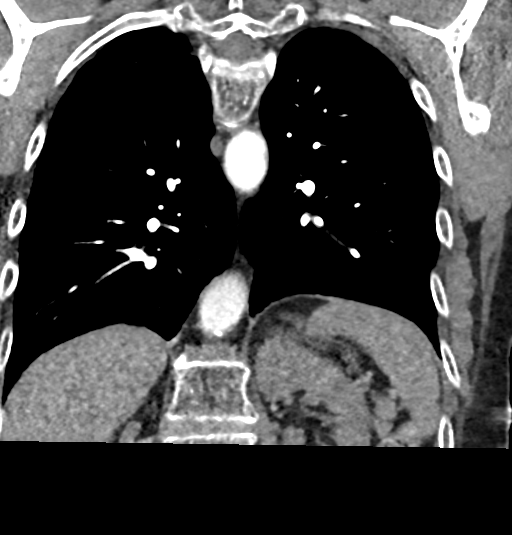

[18 of 46 positions shown; findings below may reference images not displayed]

RADIATION DOSE REDUCTION: This exam was performed according to the
departmental dose-optimization program which includes automated
exposure control, adjustment of the mA and/or kV according to
patient size and/or use of iterative reconstruction technique.

CONTRAST:  75mL OMNIPAQUE IOHEXOL 350 MG/ML SOLN
FINDINGS: Cardiovascular: Normally opacified pulmonary arteries with no
pulmonary arterial filling defects seen. Minimal atheromatous aortic
calcification. Left main and left anterior descending coronary
artery calcification. Normal sized heart. Small pericardial effusion
with a maximum thickness of 10 mm.

Mediastinum/Nodes: No enlarged mediastinal, hilar, or axillary lymph
nodes. Thyroid gland, trachea, and esophagus demonstrate no
significant findings.

Lungs/Pleura: Mild-to-moderate diffuse peribronchial thickening.
Small amount of biapical pleural and parenchymal scarring. No
pneumothorax or pleural fluid.

Upper Abdomen: Unremarkable.

Musculoskeletal: Multiple thoracic spine vertebral compression
deformities of varying degrees without significant change since the
radiograph from earlier today. No acute fracture lines seen. Mild
bony retropulsion involving the 2 fractures in the inferior thoracic
spine without significant canal stenosis.

Review of the MIP images confirms the above findings.
IMPRESSION: 1. No pulmonary emboli or other acute abnormality.
2.  Calcific coronary artery and aortic atherosclerosis.
3. Mild-to-moderate bronchitic changes.
4. Multiple thoracic spine vertebral compression fractures of
varying degrees with no visible acute fracture lines.

Aortic Atherosclerosis ([id number]).

## 2023-01-27 IMAGING — CR DG CHEST 2V
1 series · 2 of 2 positions shown · non-contrast
Comparison: 10/01/2020 and 09/22/2019 no mediastinal or. CT,
10/24/2012.

CLINICAL DATA: Left anterior chest wall pain after awakening this
morning.

EXAM:
CHEST - 2 VIEW

[Series 1: dg chest 2 view · 0.14mm/px · 2 of 2 slices shown]
[im 1/2]
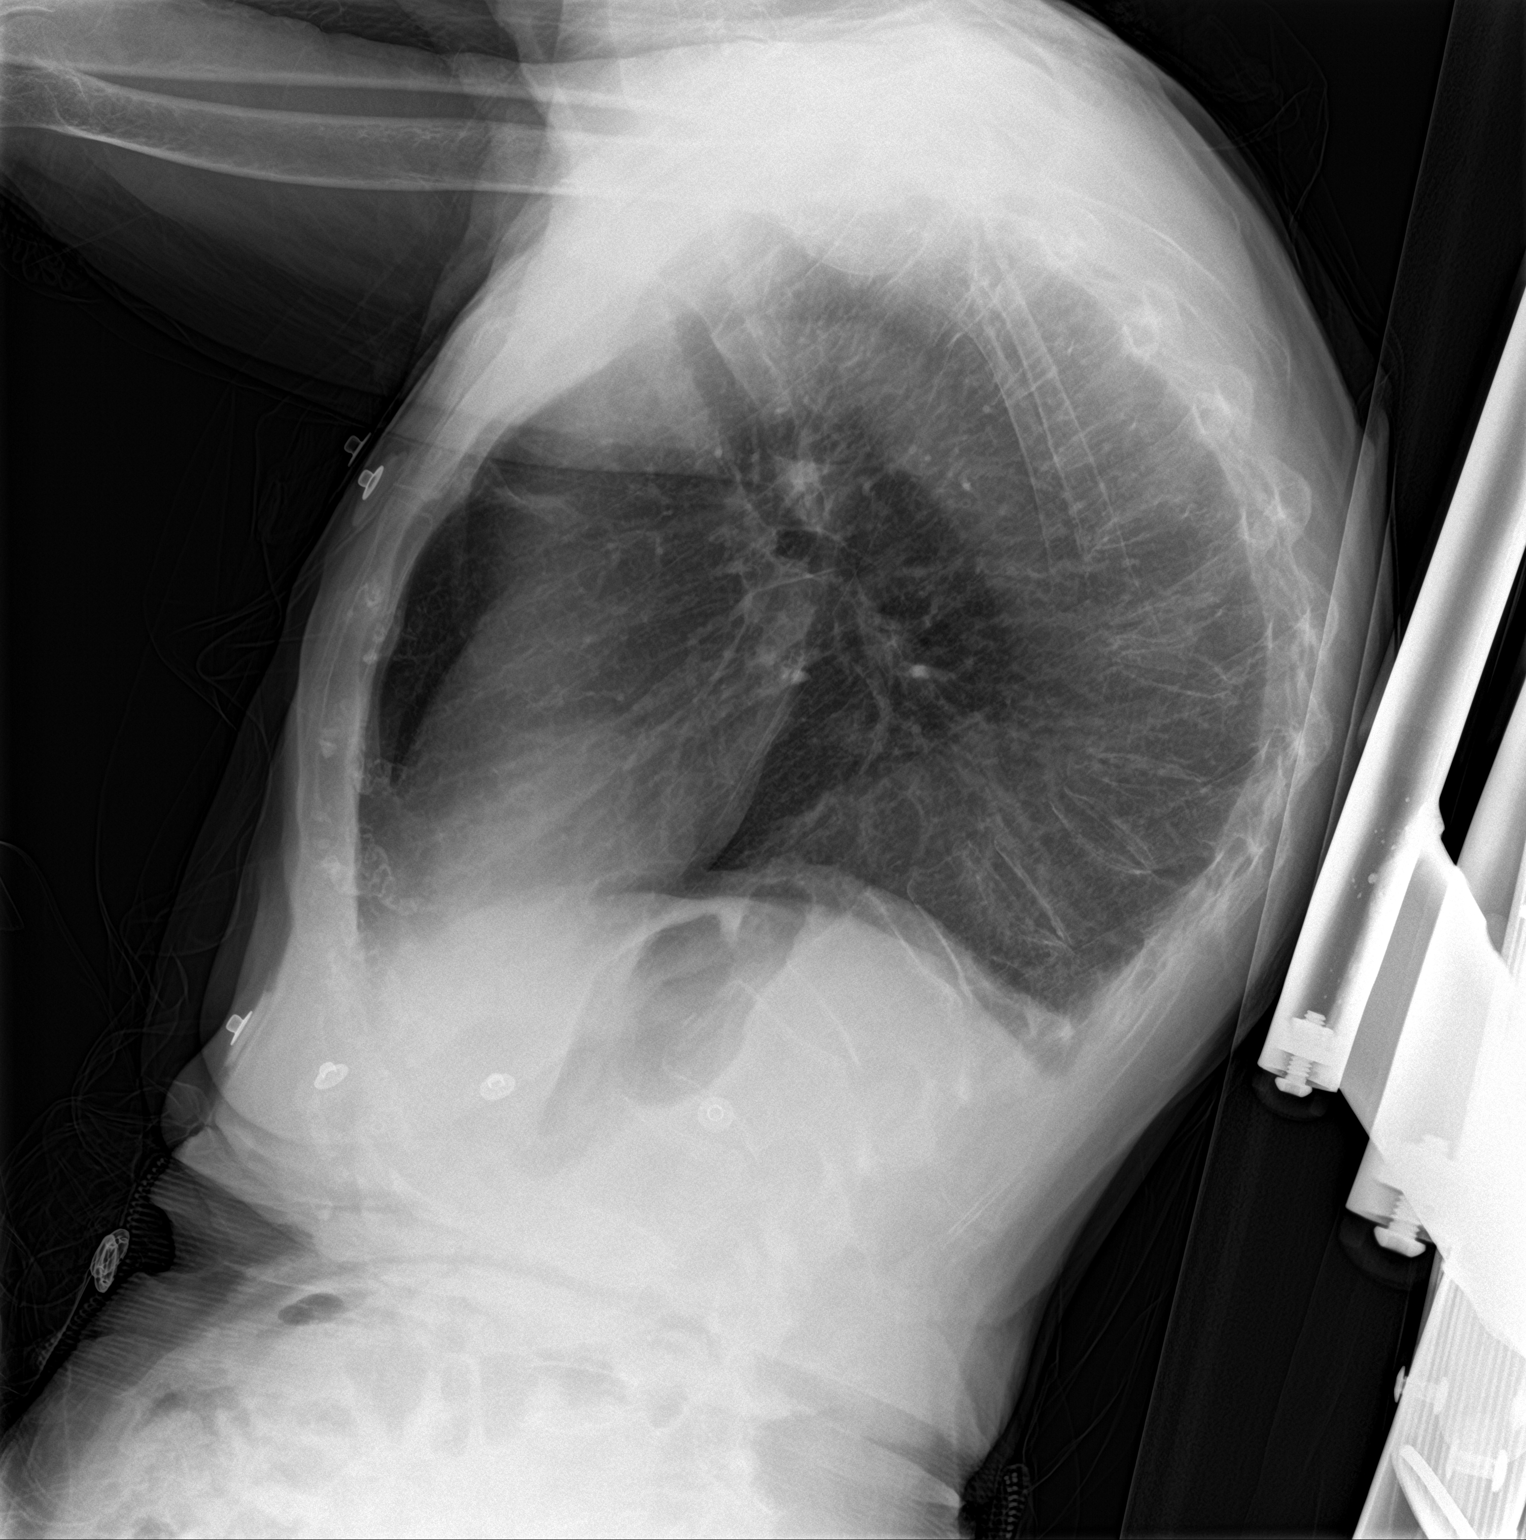
[im 2/2]
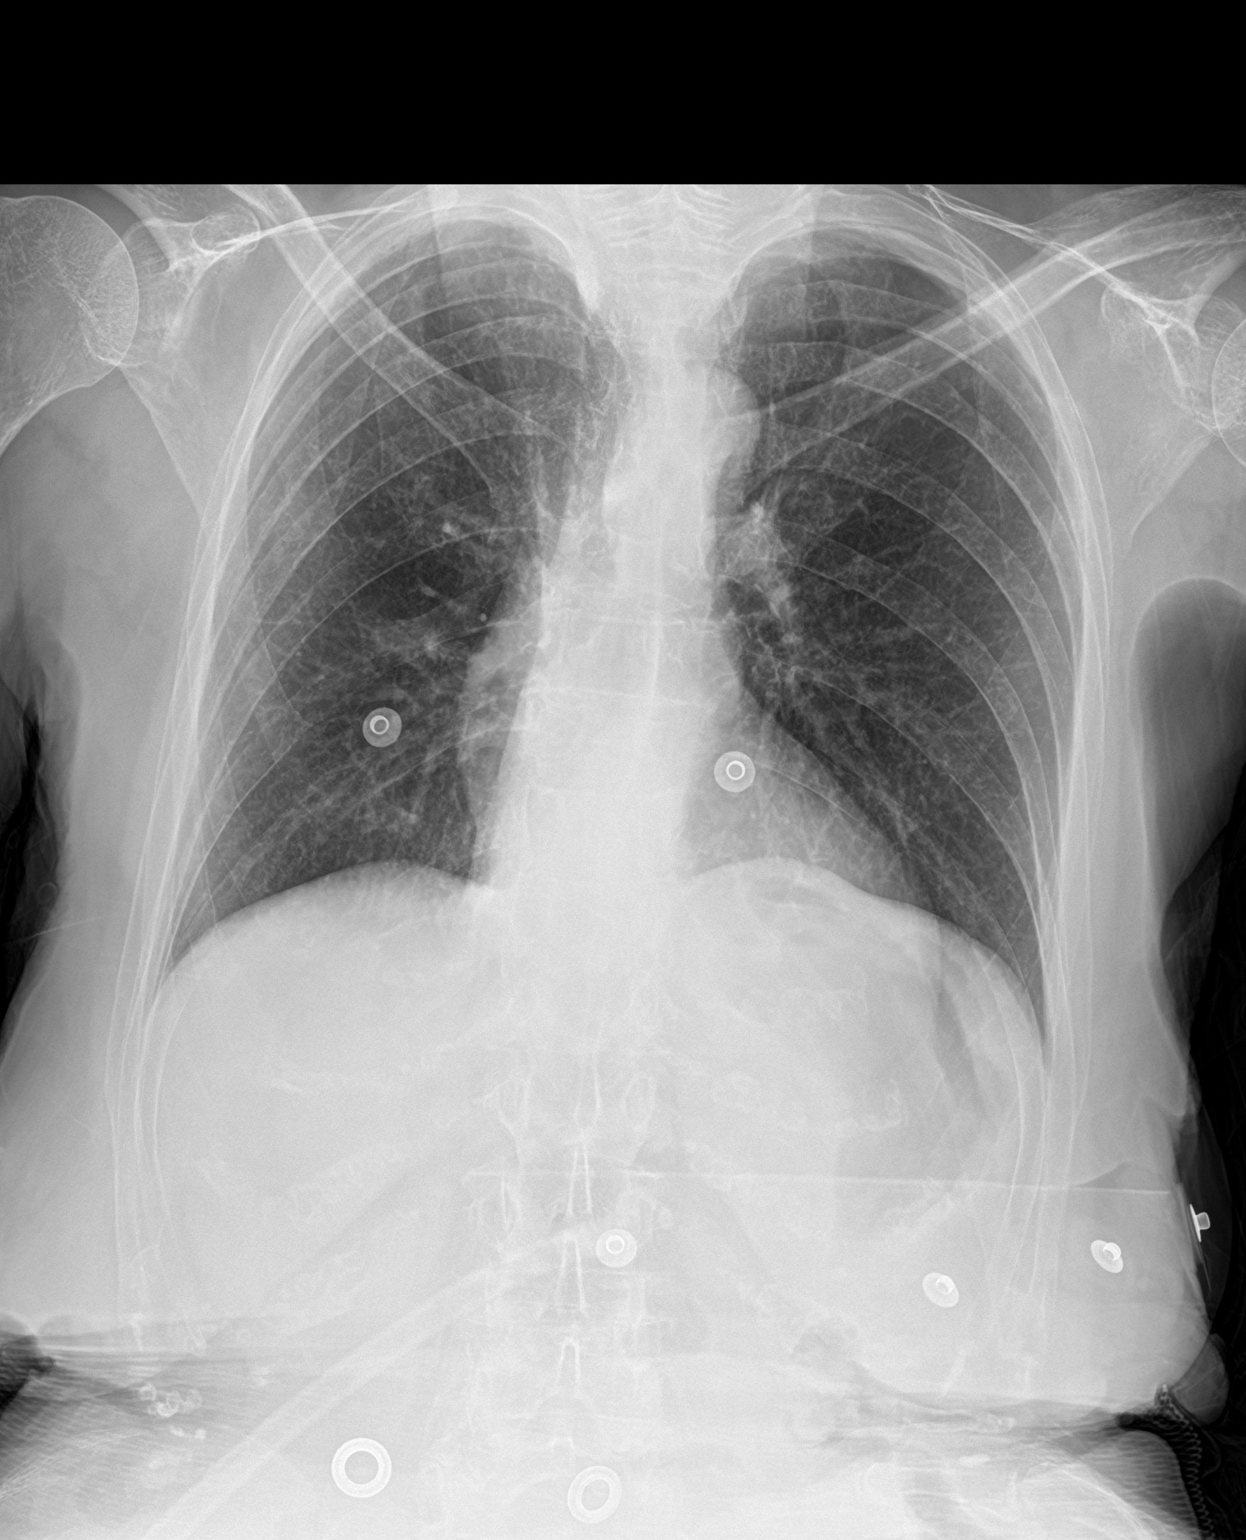

[2 of 2 positions shown; findings below may reference images not displayed]

FINDINGS: Cardiac silhouette is normal in size. Hilar masses. No evidence of
adenopathy.

Small focus of scarring, anterior right upper lobe. Lungs are
hyperexpanded but otherwise clear.

Chronic compression fractures of the lower thoracic spine, what
appear to be T9, T10 and T11. There is a moderate to severe
compression fracture in the midthoracic spine, likely T7, which was
not present on the prior lateral chest radiograph from 09/22/2019.
No other fractures. No visualized rib fracture.
IMPRESSION: 1. No acute cardiopulmonary disease.
2. Thoracic vertebral fractures. In midthoracic vertebral fracture,
likely T7, is new compared to the radiographs from 09/22/2019, and
could be recent.

## 2023-04-03 ENCOUNTER — Other Ambulatory Visit: Payer: Self-pay | Admitting: Family Medicine

## 2023-04-03 DIAGNOSIS — Z1231 Encounter for screening mammogram for malignant neoplasm of breast: Secondary | ICD-10-CM

## 2023-04-03 DIAGNOSIS — R918 Other nonspecific abnormal finding of lung field: Secondary | ICD-10-CM

## 2023-04-05 ENCOUNTER — Ambulatory Visit
Admission: RE | Admit: 2023-04-05 | Discharge: 2023-04-05 | Disposition: A | Discharge status: Home / Self Care | Payer: Medicare Other | Source: Ambulatory Visit | Attending: Family Medicine | Admitting: Family Medicine

## 2023-04-05 DIAGNOSIS — R918 Other nonspecific abnormal finding of lung field: Secondary | ICD-10-CM | POA: Diagnosis present

## 2023-04-11 ENCOUNTER — Ambulatory Visit
Admission: RE | Admit: 2023-04-11 | Discharge: 2023-04-11 | Disposition: A | Discharge status: Home / Self Care | Payer: Medicare Other | Source: Ambulatory Visit | Attending: Family Medicine | Admitting: Family Medicine

## 2023-04-11 DIAGNOSIS — Z1231 Encounter for screening mammogram for malignant neoplasm of breast: Secondary | ICD-10-CM | POA: Insufficient documentation

## 2023-04-17 ENCOUNTER — Other Ambulatory Visit: Payer: Self-pay | Admitting: Family Medicine

## 2023-04-17 DIAGNOSIS — R928 Other abnormal and inconclusive findings on diagnostic imaging of breast: Secondary | ICD-10-CM

## 2023-04-18 ENCOUNTER — Ambulatory Visit
Admission: RE | Admit: 2023-04-18 | Discharge: 2023-04-18 | Disposition: A | Discharge status: Home / Self Care | Payer: Medicare Other | Source: Ambulatory Visit | Attending: Family Medicine | Admitting: Family Medicine

## 2023-04-18 DIAGNOSIS — R928 Other abnormal and inconclusive findings on diagnostic imaging of breast: Secondary | ICD-10-CM | POA: Insufficient documentation
